# Patient Record
Sex: Female | Born: 1950 | Race: White | Hispanic: No | Smoking: Never smoker
Health system: Southern US, Community
[De-identification: ages and names within clinical notes are randomized; demographics above are authoritative.]

## PROBLEM LIST (undated history)

## (undated) DIAGNOSIS — D649 Anemia, unspecified: Secondary | ICD-10-CM

## (undated) DIAGNOSIS — H9190 Unspecified hearing loss, unspecified ear: Secondary | ICD-10-CM

## (undated) DIAGNOSIS — C801 Malignant (primary) neoplasm, unspecified: Secondary | ICD-10-CM

## (undated) DIAGNOSIS — Z8759 Personal history of other complications of pregnancy, childbirth and the puerperium: Secondary | ICD-10-CM

## (undated) DIAGNOSIS — M1990 Unspecified osteoarthritis, unspecified site: Secondary | ICD-10-CM

## (undated) DIAGNOSIS — Z87898 Personal history of other specified conditions: Secondary | ICD-10-CM

## (undated) DIAGNOSIS — N83209 Unspecified ovarian cyst, unspecified side: Secondary | ICD-10-CM

## (undated) DIAGNOSIS — Z9882 Breast implant status: Secondary | ICD-10-CM

## (undated) DIAGNOSIS — I10 Essential (primary) hypertension: Secondary | ICD-10-CM

## (undated) DIAGNOSIS — Z87828 Personal history of other (healed) physical injury and trauma: Secondary | ICD-10-CM

## (undated) HISTORY — PX: COSMETIC SURGERY: SHX468

## (undated) HISTORY — DX: Anemia, unspecified: D64.9

## (undated) HISTORY — DX: Personal history of other specified conditions: Z87.898

## (undated) HISTORY — DX: Personal history of other complications of pregnancy, childbirth and the puerperium: Z87.59

## (undated) HISTORY — DX: Breast implant status: Z98.82

## (undated) HISTORY — DX: Personal history of other (healed) physical injury and trauma: Z87.828

## (undated) HISTORY — PX: GYNECOLOGIC CRYOSURGERY: SHX857

## (undated) HISTORY — DX: Unspecified ovarian cyst, unspecified side: N83.209

## (undated) HISTORY — PX: NOSE SURGERY: SHX723

---
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
## 1978-09-13 HISTORY — PX: AUGMENTATION MAMMAPLASTY: SUR837

## 2005-05-07 ENCOUNTER — Ambulatory Visit: Payer: Self-pay | Admitting: Internal Medicine

## 2006-01-17 ENCOUNTER — Ambulatory Visit: Payer: Self-pay | Admitting: Unknown Physician Specialty

## 2006-06-24 ENCOUNTER — Ambulatory Visit: Payer: Self-pay | Admitting: Internal Medicine

## 2006-09-02 ENCOUNTER — Ambulatory Visit: Payer: Self-pay | Admitting: Urology

## 2007-10-30 ENCOUNTER — Ambulatory Visit: Payer: Self-pay | Admitting: Internal Medicine

## 2008-10-31 ENCOUNTER — Ambulatory Visit: Payer: Self-pay | Admitting: Internal Medicine

## 2009-11-20 ENCOUNTER — Ambulatory Visit: Payer: Self-pay | Admitting: Internal Medicine

## 2010-11-23 ENCOUNTER — Ambulatory Visit: Payer: Self-pay | Admitting: Internal Medicine

## 2010-11-23 LAB — HM MAMMOGRAPHY

## 2011-11-19 LAB — HM PAP SMEAR

## 2011-11-26 LAB — HEPATIC FUNCTION PANEL
AST: 23 U/L (ref 13–35)
Bilirubin, Total: 0.4 mg/dL

## 2011-11-26 LAB — BASIC METABOLIC PANEL
BUN: 21 mg/dL (ref 4–21)
Creatinine: 0.8 mg/dL (ref <=1.1)
Glucose: 76 mg/dL
Potassium: 4.5 mmol/L (ref 3.4–5.3)

## 2011-11-26 LAB — LIPID PANEL: Triglycerides: 81 mg/dL (ref 40–160)

## 2011-12-28 ENCOUNTER — Ambulatory Visit: Payer: Self-pay | Admitting: Internal Medicine

## 2011-12-28 LAB — HM MAMMOGRAPHY

## 2012-10-09 ENCOUNTER — Encounter: Payer: Self-pay | Admitting: *Deleted

## 2012-10-10 ENCOUNTER — Ambulatory Visit (INDEPENDENT_AMBULATORY_CARE_PROVIDER_SITE_OTHER): Payer: BC Managed Care – PPO | Admitting: Internal Medicine

## 2012-10-10 ENCOUNTER — Other Ambulatory Visit (HOSPITAL_COMMUNITY)
Admission: RE | Admit: 2012-10-10 | Discharge: 2012-10-10 | Disposition: A | Discharge status: Home / Self Care | Payer: BC Managed Care – PPO | Source: Ambulatory Visit | Attending: Internal Medicine | Admitting: Internal Medicine

## 2012-10-10 ENCOUNTER — Encounter: Payer: Self-pay | Admitting: Internal Medicine

## 2012-10-10 VITALS — BP 110/60 | HR 66 | Temp 98.4°F | Ht 66.0 in | Wt 143.8 lb

## 2012-10-10 DIAGNOSIS — Z87898 Personal history of other specified conditions: Secondary | ICD-10-CM

## 2012-10-10 DIAGNOSIS — Z01419 Encounter for gynecological examination (general) (routine) without abnormal findings: Secondary | ICD-10-CM | POA: Insufficient documentation

## 2012-10-10 DIAGNOSIS — C439 Malignant melanoma of skin, unspecified: Secondary | ICD-10-CM

## 2012-10-10 DIAGNOSIS — R319 Hematuria, unspecified: Secondary | ICD-10-CM

## 2012-10-10 DIAGNOSIS — Z139 Encounter for screening, unspecified: Secondary | ICD-10-CM

## 2012-10-10 DIAGNOSIS — Z8742 Personal history of other diseases of the female genital tract: Secondary | ICD-10-CM

## 2012-10-10 DIAGNOSIS — D649 Anemia, unspecified: Secondary | ICD-10-CM

## 2012-10-10 DIAGNOSIS — R0989 Other specified symptoms and signs involving the circulatory and respiratory systems: Secondary | ICD-10-CM

## 2012-10-10 DIAGNOSIS — R5383 Other fatigue: Secondary | ICD-10-CM

## 2012-10-10 DIAGNOSIS — Z800 Family history of malignant neoplasm of digestive organs: Secondary | ICD-10-CM

## 2012-10-10 DIAGNOSIS — Z1151 Encounter for screening for human papillomavirus (HPV): Secondary | ICD-10-CM | POA: Insufficient documentation

## 2012-10-10 MED ORDER — ALPRAZOLAM 0.25 MG PO TABS: 0.2500 mg | ORAL_TABLET | Freq: Every day | ORAL | Status: DC | PRN

## 2012-10-11 ENCOUNTER — Encounter: Payer: Self-pay | Admitting: Internal Medicine

## 2012-10-11 DIAGNOSIS — D649 Anemia, unspecified: Secondary | ICD-10-CM | POA: Insufficient documentation

## 2012-10-11 DIAGNOSIS — Z800 Family history of malignant neoplasm of digestive organs: Secondary | ICD-10-CM | POA: Insufficient documentation

## 2012-10-11 DIAGNOSIS — R319 Hematuria, unspecified: Secondary | ICD-10-CM | POA: Insufficient documentation

## 2012-10-11 DIAGNOSIS — C439 Malignant melanoma of skin, unspecified: Secondary | ICD-10-CM | POA: Insufficient documentation

## 2012-10-11 DIAGNOSIS — R87619 Unspecified abnormal cytological findings in specimens from cervix uteri: Secondary | ICD-10-CM | POA: Insufficient documentation
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
## 2012-10-11 NOTE — Assessment & Plan Note (Signed)
Last colonoscopy 2007.  Overdue.  Refer back to GI for colonoscopy.

## 2012-10-11 NOTE — Assessment & Plan Note (Signed)
S/p cryosurgery.  Repeat pap today.

## 2012-10-11 NOTE — Assessment & Plan Note (Signed)
Colonoscopy as outlined.  Overdue.  Will refer back for colonoscopy.  Recheck cbc.

## 2012-10-11 NOTE — Assessment & Plan Note (Signed)
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  Recheck urinalysis with next labs.  (saw Dr Achilles Dunk).

## 2012-10-11 NOTE — Assessment & Plan Note (Signed)
Sees dermatology.  CXR 07/21/10 - negative.  Was going to discuss with dermatology regarding serial cxr's.  Check liver panel.  Follow.

## 2012-10-16 ENCOUNTER — Ambulatory Visit: Payer: Self-pay | Admitting: Internal Medicine

## 2012-10-19 ENCOUNTER — Other Ambulatory Visit (INDEPENDENT_AMBULATORY_CARE_PROVIDER_SITE_OTHER): Payer: BC Managed Care – PPO

## 2012-10-19 DIAGNOSIS — R5381 Other malaise: Secondary | ICD-10-CM

## 2012-10-19 DIAGNOSIS — R5383 Other fatigue: Secondary | ICD-10-CM

## 2012-10-19 DIAGNOSIS — D649 Anemia, unspecified: Secondary | ICD-10-CM

## 2012-10-19 DIAGNOSIS — Z139 Encounter for screening, unspecified: Secondary | ICD-10-CM

## 2012-10-19 DIAGNOSIS — R319 Hematuria, unspecified: Secondary | ICD-10-CM

## 2012-10-19 LAB — COMPREHENSIVE METABOLIC PANEL
ALT: 18 U/L (ref 0–35)
AST: 23 U/L (ref 0–37)
Albumin: 3.9 g/dL (ref 3.5–5.2)
BUN: 18 mg/dL (ref 6–23)
CO2: 28 mEq/L (ref 19–32)
Calcium: 9.3 mg/dL (ref 8.4–10.5)
Chloride: 102 mEq/L (ref 96–112)
Creatinine, Ser: 0.7 mg/dL (ref 0.4–1.2)
GFR: 85.02 mL/min (ref >60.00)
Potassium: 4.7 mEq/L (ref 3.5–5.1)

## 2012-10-19 LAB — CBC WITH DIFFERENTIAL/PLATELET
Basophils Relative: 0.5 % (ref 0.0–3.0)
Eosinophils Absolute: 0.1 10*3/uL (ref 0.0–0.7)
Hemoglobin: 11.2 g/dL — ABNORMAL LOW (ref 12.0–15.0)
Lymphocytes Relative: 27.9 % (ref 12.0–46.0)
Monocytes Relative: 9.2 % (ref 3.0–12.0)
Neutro Abs: 3.8 10*3/uL (ref 1.4–7.7)
Neutrophils Relative %: 61.1 % (ref 43.0–77.0)
RBC: 3.72 Mil/uL — ABNORMAL LOW (ref 3.87–5.11)
WBC: 6.3 10*3/uL (ref 4.5–10.5)

## 2012-10-19 LAB — LIPID PANEL
Cholesterol: 197 mg/dL (ref 0–200)
Total CHOL/HDL Ratio: 3
Triglycerides: 61 mg/dL (ref 0.0–149.0)

## 2012-10-19 LAB — URINALYSIS, ROUTINE W REFLEX MICROSCOPIC
Nitrite: NEGATIVE
Specific Gravity, Urine: <=1.005 (ref 1.000–1.030)
Urine Glucose: NEGATIVE
Urobilinogen, UA: 0.2 (ref 0.0–1.0)

## 2012-10-20 ENCOUNTER — Encounter: Payer: Self-pay | Admitting: Internal Medicine

## 2012-10-20 ENCOUNTER — Telehealth: Payer: Self-pay | Admitting: Internal Medicine

## 2012-10-20 NOTE — Telephone Encounter (Signed)
Pt notified of labs via my chart.  

## 2012-10-26 ENCOUNTER — Encounter: Payer: Self-pay | Admitting: Internal Medicine

## 2012-10-28 ENCOUNTER — Other Ambulatory Visit: Payer: Self-pay

## 2012-10-30 ENCOUNTER — Encounter: Payer: Self-pay | Admitting: Internal Medicine

## 2013-01-02 ENCOUNTER — Ambulatory Visit: Payer: Self-pay | Admitting: Internal Medicine

## 2013-01-06 ENCOUNTER — Encounter: Payer: Self-pay | Admitting: Internal Medicine

## 2013-01-06 DIAGNOSIS — D649 Anemia, unspecified: Secondary | ICD-10-CM

## 2013-01-29 ENCOUNTER — Encounter: Payer: Self-pay | Admitting: Internal Medicine

## 2013-04-09 ENCOUNTER — Ambulatory Visit: Payer: BC Managed Care – PPO | Admitting: Internal Medicine

## 2013-05-15 ENCOUNTER — Ambulatory Visit (INDEPENDENT_AMBULATORY_CARE_PROVIDER_SITE_OTHER): Payer: BC Managed Care – PPO | Admitting: Internal Medicine

## 2013-05-15 ENCOUNTER — Encounter: Payer: Self-pay | Admitting: Internal Medicine

## 2013-05-15 VITALS — BP 102/70 | HR 121 | Temp 98.2°F | Wt 143.0 lb

## 2013-05-15 DIAGNOSIS — R319 Hematuria, unspecified: Secondary | ICD-10-CM

## 2013-05-15 DIAGNOSIS — Z8742 Personal history of other diseases of the female genital tract: Secondary | ICD-10-CM

## 2013-05-15 DIAGNOSIS — Z87898 Personal history of other specified conditions: Secondary | ICD-10-CM

## 2013-05-15 DIAGNOSIS — C439 Malignant melanoma of skin, unspecified: Secondary | ICD-10-CM

## 2013-05-15 DIAGNOSIS — Z800 Family history of malignant neoplasm of digestive organs: Secondary | ICD-10-CM

## 2013-05-15 DIAGNOSIS — D649 Anemia, unspecified: Secondary | ICD-10-CM
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
## 2013-05-16 LAB — CBC WITH DIFFERENTIAL/PLATELET
Basophils Relative: 0.9 % (ref 0.0–3.0)
Eosinophils Absolute: 0.2 10*3/uL (ref 0.0–0.7)
Eosinophils Relative: 3.5 % (ref 0.0–5.0)
Hemoglobin: 10.7 g/dL — ABNORMAL LOW (ref 12.0–15.0)
Lymphocytes Relative: 36.9 % (ref 12.0–46.0)
MCHC: 34.3 g/dL (ref 30.0–36.0)
MCV: 90.4 fl (ref 78.0–100.0)
Monocytes Absolute: 0.7 10*3/uL (ref 0.1–1.0)
Neutro Abs: 3 10*3/uL (ref 1.4–7.7)
RBC: 3.45 Mil/uL — ABNORMAL LOW (ref 3.87–5.11)
WBC: 6.3 10*3/uL (ref 4.5–10.5)

## 2013-05-17 ENCOUNTER — Encounter: Payer: Self-pay | Admitting: Internal Medicine

## 2013-05-17 ENCOUNTER — Telehealth: Payer: Self-pay | Admitting: Internal Medicine

## 2013-05-17 DIAGNOSIS — D649 Anemia, unspecified: Secondary | ICD-10-CM

## 2013-05-17 NOTE — Telephone Encounter (Signed)
Pt notified via my chart of lab results and need for f/u lab within the next few weeks.  Please schedule her for a non fasting lab appt within the next 3 weeks and notify her of appt date and time.    Thanks.

## 2013-05-18 ENCOUNTER — Encounter: Payer: Self-pay | Admitting: Internal Medicine

## 2013-05-18 NOTE — Assessment & Plan Note (Signed)
S/p cryosurgery.  Last pap 1/14 - ok.     

## 2013-05-18 NOTE — Assessment & Plan Note (Signed)
Last colonoscopy 2007.  Overdue.  Discussed with her today regarding referral back to GI.  She declines.  States she will notify me when agreeable.

## 2013-05-18 NOTE — Assessment & Plan Note (Signed)
Colonoscopy as outlined.  Overdue.  Declines referral back at this time.  Will notify me when agreeable.  Recheck cbc.

## 2013-05-18 NOTE — Assessment & Plan Note (Signed)
Sees dermatology.  CXR 07/21/10 - negative.  Was going to discuss with dermatology regarding serial cxr's.  Follow liver panel.

## 2013-05-18 NOTE — Telephone Encounter (Signed)
Appointment 9/29 pt aware

## 2013-05-18 NOTE — Assessment & Plan Note (Signed)
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  Saw Dr Cope.  Last urinalysis - no rbc's.   

## 2013-06-11 ENCOUNTER — Other Ambulatory Visit (INDEPENDENT_AMBULATORY_CARE_PROVIDER_SITE_OTHER): Payer: BC Managed Care – PPO

## 2013-06-11 DIAGNOSIS — D649 Anemia, unspecified: Secondary | ICD-10-CM

## 2013-06-11 LAB — IBC PANEL
Iron: 62 ug/dL (ref 42–145)
Transferrin: 229.2 mg/dL (ref 212.0–360.0)

## 2013-06-11 LAB — CBC WITH DIFFERENTIAL/PLATELET
Basophils Absolute: 0 10*3/uL (ref 0.0–0.1)
Basophils Relative: 0.3 % (ref 0.0–3.0)
Eosinophils Absolute: 0.2 10*3/uL (ref 0.0–0.7)
Lymphocytes Relative: 35.2 % (ref 12.0–46.0)
MCHC: 34 g/dL (ref 30.0–36.0)
Neutrophils Relative %: 53.2 % (ref 43.0–77.0)
Platelets: 366 10*3/uL (ref 150.0–400.0)
RBC: 3.45 Mil/uL — ABNORMAL LOW (ref 3.87–5.11)

## 2013-06-11 LAB — VITAMIN B12: Vitamin B-12: 1027 pg/mL — ABNORMAL HIGH (ref 211–911)

## 2013-06-13 ENCOUNTER — Telehealth: Payer: Self-pay | Admitting: Internal Medicine

## 2013-06-13 DIAGNOSIS — D649 Anemia, unspecified: Secondary | ICD-10-CM

## 2013-06-13 NOTE — Telephone Encounter (Signed)
See my chart message Appointment Request From: Areatha Keas  With Provider: Charm Barges, MD [-Primary Care Physician-]  Preferred Date Range: From 07/17/2013 To 08/07/2013  Preferred Times: Tuesday Morning Reason: To address the following health maintenance concerns. Colonoscopy  Comments: Please schedule my colonoscopy on any Tues morning in November or please give me the number to call to schedule it. Not having any problems. I was reminded by Valinda Hoar that it is overdue. Thanks, [employer]

## 2013-06-13 NOTE — Telephone Encounter (Signed)
See her message.  Order placed for gastr referral. Needs colonoscopy.  See her message for times or she will call.  Thanks.

## 2013-06-14 ENCOUNTER — Encounter: Payer: Self-pay | Admitting: Internal Medicine

## 2013-07-09 ENCOUNTER — Telehealth: Payer: Self-pay | Admitting: Internal Medicine

## 2013-07-09 NOTE — Telephone Encounter (Signed)
In your purple folder

## 2013-07-09 NOTE — Telephone Encounter (Signed)
Form completed.  Placed in your box.   

## 2013-07-09 NOTE — Telephone Encounter (Signed)
Pt came into clinic to drop of paperwork for proof of physical.  Placed in Dr. Roby Lofts box.  Please call pt when ready for pickup.

## 2013-07-10 NOTE — Telephone Encounter (Signed)
Left message on pt voicemail to notify pt that her form has been placed up front

## 2013-07-19 ENCOUNTER — Other Ambulatory Visit: Payer: Self-pay

## 2013-10-11 LAB — HM COLONOSCOPY

## 2013-10-18 ENCOUNTER — Encounter: Payer: Self-pay | Admitting: Internal Medicine

## 2013-11-08 ENCOUNTER — Other Ambulatory Visit: Payer: BC Managed Care – PPO

## 2013-11-11 ENCOUNTER — Encounter: Payer: Self-pay | Admitting: Internal Medicine

## 2013-11-14 ENCOUNTER — Telehealth: Payer: Self-pay | Admitting: *Deleted

## 2013-11-14 ENCOUNTER — Other Ambulatory Visit (INDEPENDENT_AMBULATORY_CARE_PROVIDER_SITE_OTHER): Payer: BC Managed Care – PPO

## 2013-11-14 ENCOUNTER — Encounter: Payer: Self-pay | Admitting: Internal Medicine

## 2013-11-14 DIAGNOSIS — D649 Anemia, unspecified: Secondary | ICD-10-CM

## 2013-11-14 LAB — CBC WITH DIFFERENTIAL/PLATELET
BASOS ABS: 0 10*3/uL (ref 0.0–0.1)
BASOS PCT: 0.5 % (ref 0.0–3.0)
EOS PCT: 0.3 % (ref 0.0–5.0)
Eosinophils Absolute: 0 10*3/uL (ref 0.0–0.7)
HCT: 33.1 % — ABNORMAL LOW (ref 36.0–46.0)
HEMOGLOBIN: 11 g/dL — AB (ref 12.0–15.0)
LYMPHS PCT: 33.3 % (ref 12.0–46.0)
Lymphs Abs: 1.6 10*3/uL (ref 0.7–4.0)
MCHC: 33.3 g/dL (ref 30.0–36.0)
MCV: 92.8 fl (ref 78.0–100.0)
MONOS PCT: 23.5 % — AB (ref 3.0–12.0)
Monocytes Absolute: 1.1 10*3/uL — ABNORMAL HIGH (ref 0.1–1.0)
NEUTROS ABS: 2 10*3/uL (ref 1.4–7.7)
Neutrophils Relative %: 42.4 % — ABNORMAL LOW (ref 43.0–77.0)
Platelets: 290 10*3/uL (ref 150.0–400.0)
RBC: 3.57 Mil/uL — AB (ref 3.87–5.11)
RDW: 13 % (ref 11.5–14.6)
WBC: 4.8 10*3/uL (ref 4.5–10.5)

## 2013-11-14 NOTE — Telephone Encounter (Signed)
Cbc ordered

## 2013-11-14 NOTE — Telephone Encounter (Signed)
What labs and dx?  

## 2013-11-15 ENCOUNTER — Encounter: Payer: Self-pay | Admitting: Internal Medicine

## 2013-11-15 ENCOUNTER — Ambulatory Visit (INDEPENDENT_AMBULATORY_CARE_PROVIDER_SITE_OTHER): Payer: BC Managed Care – PPO | Admitting: Internal Medicine

## 2013-11-15 VITALS — BP 110/68 | HR 70 | Temp 98.5°F | Ht 64.75 in | Wt 129.2 lb

## 2013-11-15 DIAGNOSIS — Z1239 Encounter for other screening for malignant neoplasm of breast: Secondary | ICD-10-CM

## 2013-11-15 DIAGNOSIS — Z13220 Encounter for screening for lipoid disorders: Secondary | ICD-10-CM

## 2013-11-15 DIAGNOSIS — Z800 Family history of malignant neoplasm of digestive organs: Secondary | ICD-10-CM

## 2013-11-15 DIAGNOSIS — R059 Cough, unspecified: Secondary | ICD-10-CM

## 2013-11-15 DIAGNOSIS — Z978 Presence of other specified devices: Secondary | ICD-10-CM

## 2013-11-15 DIAGNOSIS — R319 Hematuria, unspecified: Secondary | ICD-10-CM

## 2013-11-15 DIAGNOSIS — Z9882 Breast implant status: Secondary | ICD-10-CM

## 2013-11-15 DIAGNOSIS — C439 Malignant melanoma of skin, unspecified: Secondary | ICD-10-CM

## 2013-11-15 DIAGNOSIS — D649 Anemia, unspecified: Secondary | ICD-10-CM

## 2013-11-15 DIAGNOSIS — R05 Cough: Secondary | ICD-10-CM

## 2013-11-15 NOTE — Progress Notes (Signed)
Pre-visit discussion using our clinic review tool. No additional management support is needed unless otherwise documented below in the visit note.  

## 2013-11-18 ENCOUNTER — Encounter: Payer: Self-pay | Admitting: Internal Medicine

## 2013-11-18 NOTE — Assessment & Plan Note (Signed)
Colonoscopy and EGD as outlined.  Recent hgb 11.0.  Follow.

## 2013-11-18 NOTE — Assessment & Plan Note (Signed)
S/p cryosurgery.  Last pap 1/14 - ok.     

## 2013-11-18 NOTE — Assessment & Plan Note (Signed)
Colonoscopy as outlined.  Follow.  Recommend f/u colonoscopy in 3 years.
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
## 2013-11-18 NOTE — Assessment & Plan Note (Signed)
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  Saw Dr Cope.  Last urinalysis - no rbc's.   

## 2013-11-18 NOTE — Assessment & Plan Note (Signed)
Sees dermatology.  CXR 07/21/10 - negative.   Follow liver panel.  Check cxr.

## 2013-11-21 ENCOUNTER — Telehealth: Payer: Self-pay | Admitting: Internal Medicine

## 2013-11-21 ENCOUNTER — Encounter: Payer: Self-pay | Admitting: Emergency Medicine

## 2013-11-21 ENCOUNTER — Other Ambulatory Visit: Payer: Self-pay | Admitting: Internal Medicine

## 2013-11-21 DIAGNOSIS — Z13220 Encounter for screening for lipoid disorders: Secondary | ICD-10-CM

## 2013-11-21 DIAGNOSIS — C439 Malignant melanoma of skin, unspecified: Secondary | ICD-10-CM

## 2013-11-21 DIAGNOSIS — D649 Anemia, unspecified: Secondary | ICD-10-CM

## 2013-11-21 NOTE — Telephone Encounter (Signed)
See below message Please let me know if it is ok to make appointment     Appointment Request From: Dallie Dad      With Provider: Alisa Graff, MD [-Primary Care Physician-]      Preferred Date Range: From 11/21/2013 To 11/23/2013      Preferred Times: Wednesday Morning, Thursday Morning, Friday Morning      Reason for visit: Acute Office Visit      Comments:   Dr. Nicki Reaper forgot to check my cholesterol when she ordered my blood work last week. Can you please ask her to schedule that test.    Thanks,   Silva Bandy

## 2013-11-21 NOTE — Telephone Encounter (Signed)
Ok to schedule a fasting lab on one of these days.  I will place the order for the labs.  Thanks.

## 2013-11-21 NOTE — Progress Notes (Signed)
Order placed for f/u labs.  

## 2013-11-22 NOTE — Telephone Encounter (Signed)
Appointment  3/13 sent my chart message letting pt know about appointment

## 2013-11-23 ENCOUNTER — Other Ambulatory Visit: Payer: BC Managed Care – PPO

## 2013-11-30 ENCOUNTER — Telehealth: Payer: Self-pay | Admitting: Internal Medicine

## 2013-11-30 NOTE — Telephone Encounter (Signed)
Sent mychart message

## 2013-11-30 NOTE — Telephone Encounter (Signed)
If she is due a tetanus booster, I am ok with her getting a tetanus

## 2013-11-30 NOTE — Telephone Encounter (Signed)
Pt has lab work on 3/27 and wanted to know would like to schedule a tetnus shot at the same time as my lab work if that is OK.   Is this ok to scheudle

## 2013-11-30 NOTE — Telephone Encounter (Signed)
lotoya can you see if ms wolfe is due a tetanus booster Thanks

## 2013-11-30 NOTE — Telephone Encounter (Signed)
We have no immunization records on pt, she will have to know if she is due or not.

## 2013-12-07 ENCOUNTER — Other Ambulatory Visit (INDEPENDENT_AMBULATORY_CARE_PROVIDER_SITE_OTHER): Payer: BC Managed Care – PPO

## 2013-12-07 DIAGNOSIS — Z13220 Encounter for screening for lipoid disorders: Secondary | ICD-10-CM

## 2013-12-07 DIAGNOSIS — C439 Malignant melanoma of skin, unspecified: Secondary | ICD-10-CM

## 2013-12-07 LAB — COMPREHENSIVE METABOLIC PANEL
ALT: 19 U/L (ref 0–35)
AST: 27 U/L (ref 0–37)
Albumin: 3.8 g/dL (ref 3.5–5.2)
Alkaline Phosphatase: 33 U/L — ABNORMAL LOW (ref 39–117)
BILIRUBIN TOTAL: 0.5 mg/dL (ref 0.3–1.2)
BUN: 13 mg/dL (ref 6–23)
CO2: 30 mEq/L (ref 19–32)
Calcium: 9.2 mg/dL (ref 8.4–10.5)
Chloride: 104 mEq/L (ref 96–112)
Creatinine, Ser: 0.8 mg/dL (ref 0.4–1.2)
GFR: 83.4 mL/min (ref >60.00)
Glucose, Bld: 84 mg/dL (ref 70–99)
Potassium: 4.5 mEq/L (ref 3.5–5.1)
SODIUM: 138 meq/L (ref 135–145)
Total Protein: 6.8 g/dL (ref 6.0–8.3)

## 2013-12-07 LAB — LIPID PANEL
CHOL/HDL RATIO: 3
Cholesterol: 211 mg/dL — ABNORMAL HIGH (ref 0–200)
HDL: 74.3 mg/dL (ref >39.00)
LDL Cholesterol: 128 mg/dL — ABNORMAL HIGH (ref 0–99)
Triglycerides: 43 mg/dL (ref 0.0–149.0)
VLDL: 8.6 mg/dL (ref 0.0–40.0)

## 2013-12-07 LAB — TSH: TSH: 1.69 u[IU]/mL (ref 0.35–5.50)

## 2013-12-08 ENCOUNTER — Encounter: Payer: Self-pay | Admitting: Internal Medicine

## 2013-12-20 ENCOUNTER — Encounter: Payer: Self-pay | Admitting: Internal Medicine

## 2014-01-14 ENCOUNTER — Ambulatory Visit: Payer: Self-pay | Admitting: Internal Medicine

## 2014-01-14 LAB — HM MAMMOGRAPHY: HM Mammogram: NEGATIVE

## 2014-01-15 ENCOUNTER — Encounter: Payer: Self-pay | Admitting: Internal Medicine

## 2014-01-25 ENCOUNTER — Ambulatory Visit (INDEPENDENT_AMBULATORY_CARE_PROVIDER_SITE_OTHER): Payer: BC Managed Care – PPO | Admitting: *Deleted

## 2014-01-25 DIAGNOSIS — Z23 Encounter for immunization: Secondary | ICD-10-CM

## 2014-05-22 ENCOUNTER — Encounter: Payer: Self-pay | Admitting: Internal Medicine

## 2014-05-22 ENCOUNTER — Ambulatory Visit (INDEPENDENT_AMBULATORY_CARE_PROVIDER_SITE_OTHER): Payer: BC Managed Care – PPO | Admitting: Internal Medicine

## 2014-05-22 VITALS — BP 110/70 | HR 63 | Temp 98.3°F | Ht 64.75 in | Wt 130.5 lb

## 2014-05-22 DIAGNOSIS — D649 Anemia, unspecified: Secondary | ICD-10-CM

## 2014-05-22 DIAGNOSIS — R319 Hematuria, unspecified: Secondary | ICD-10-CM

## 2014-05-22 DIAGNOSIS — C439 Malignant melanoma of skin, unspecified: Secondary | ICD-10-CM

## 2014-05-22 DIAGNOSIS — Z800 Family history of malignant neoplasm of digestive organs: Secondary | ICD-10-CM

## 2014-05-22 LAB — CBC WITH DIFFERENTIAL/PLATELET
Basophils Absolute: 0 10*3/uL (ref 0.0–0.1)
Basophils Relative: 0.3 % (ref 0.0–3.0)
EOS PCT: 1.4 % (ref 0.0–5.0)
Eosinophils Absolute: 0.1 10*3/uL (ref 0.0–0.7)
HEMATOCRIT: 33.9 % — AB (ref 36.0–46.0)
HEMOGLOBIN: 11.3 g/dL — AB (ref 12.0–15.0)
LYMPHS ABS: 1.8 10*3/uL (ref 0.7–4.0)
Lymphocytes Relative: 29.9 % (ref 12.0–46.0)
MCHC: 33.3 g/dL (ref 30.0–36.0)
MCV: 92 fl (ref 78.0–100.0)
Monocytes Absolute: 0.6 10*3/uL (ref 0.1–1.0)
Monocytes Relative: 10.3 % (ref 3.0–12.0)
NEUTROS ABS: 3.6 10*3/uL (ref 1.4–7.7)
Neutrophils Relative %: 58.1 % (ref 43.0–77.0)
Platelets: 393 10*3/uL (ref 150.0–400.0)
RBC: 3.68 Mil/uL — AB (ref 3.87–5.11)
RDW: 12.9 % (ref 11.5–15.5)
WBC: 6.2 10*3/uL (ref 4.0–10.5)

## 2014-05-22 LAB — IBC PANEL
IRON: 139 ug/dL (ref 42–145)
Saturation Ratios: 40.4 % (ref 20.0–50.0)
Transferrin: 245.6 mg/dL (ref 212.0–360.0)

## 2014-05-22 LAB — FERRITIN: Ferritin: 32.5 ng/mL (ref 10.0–291.0)

## 2014-05-22 NOTE — Progress Notes (Signed)
Pre visit review using our clinic review tool, if applicable. No additional management support is needed unless otherwise documented below in the visit note. 
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
## 2014-05-26 ENCOUNTER — Encounter: Payer: Self-pay | Admitting: Internal Medicine

## 2014-05-26 NOTE — Assessment & Plan Note (Signed)
Colonoscopy as outlined.  Follow.  Recommend f/u colonoscopy in 3 years. (due 2018)

## 2014-05-26 NOTE — Assessment & Plan Note (Signed)
Colonoscopy and EGD as outlined.  Recent hgb 11.0.  Recheck cbc and ferritin today.  On iron.  Recommended f/u colonoscopy in three years.

## 2014-05-26 NOTE — Assessment & Plan Note (Signed)
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  Saw Dr Jacqlyn Larsen.  Last urinalysis - no rbc's.

## 2014-05-26 NOTE — Assessment & Plan Note (Signed)
Sees dermatology.  CXR 07/21/10 - negative.   Follow liver panel.  She has still not had her cxr.  Plans to get.

## 2014-05-26 NOTE — Assessment & Plan Note (Signed)
S/p cryosurgery.  Last pap 1/14 - ok.

## 2014-07-12 ENCOUNTER — Telehealth: Payer: Self-pay | Admitting: *Deleted

## 2014-07-12 DIAGNOSIS — D649 Anemia, unspecified: Secondary | ICD-10-CM

## 2014-07-12 DIAGNOSIS — C439 Malignant melanoma of skin, unspecified: Secondary | ICD-10-CM

## 2014-07-12 DIAGNOSIS — E7800 Pure hypercholesterolemia, unspecified: Secondary | ICD-10-CM

## 2014-07-12 NOTE — Telephone Encounter (Signed)
Pt is coming 11.02.2015 what labs and dx?

## 2014-07-14 NOTE — Telephone Encounter (Signed)
Orders placed for labs

## 2014-07-15 ENCOUNTER — Other Ambulatory Visit (INDEPENDENT_AMBULATORY_CARE_PROVIDER_SITE_OTHER): Payer: BC Managed Care – PPO

## 2014-07-15 DIAGNOSIS — E7800 Pure hypercholesterolemia, unspecified: Secondary | ICD-10-CM

## 2014-07-15 DIAGNOSIS — C439 Malignant melanoma of skin, unspecified: Secondary | ICD-10-CM

## 2014-07-15 DIAGNOSIS — D649 Anemia, unspecified: Secondary | ICD-10-CM

## 2014-07-15 LAB — LIPID PANEL
Cholesterol: 200 mg/dL (ref 0–200)
HDL: 70.3 mg/dL (ref >39.00)
LDL CALC: 117 mg/dL — AB (ref 0–99)
NONHDL: 129.7
TRIGLYCERIDES: 62 mg/dL (ref 0.0–149.0)
Total CHOL/HDL Ratio: 3
VLDL: 12.4 mg/dL (ref 0.0–40.0)

## 2014-07-15 LAB — CBC WITH DIFFERENTIAL/PLATELET
BASOS ABS: 0 10*3/uL (ref 0.0–0.1)
Basophils Relative: 0.3 % (ref 0.0–3.0)
Eosinophils Absolute: 0.1 10*3/uL (ref 0.0–0.7)
Eosinophils Relative: 1.6 % (ref 0.0–5.0)
HEMATOCRIT: 33.6 % — AB (ref 36.0–46.0)
Hemoglobin: 11.2 g/dL — ABNORMAL LOW (ref 12.0–15.0)
LYMPHS ABS: 1.9 10*3/uL (ref 0.7–4.0)
LYMPHS PCT: 31.1 % (ref 12.0–46.0)
MCHC: 33.3 g/dL (ref 30.0–36.0)
MCV: 90.7 fl (ref 78.0–100.0)
Monocytes Absolute: 0.6 10*3/uL (ref 0.1–1.0)
Monocytes Relative: 9.6 % (ref 3.0–12.0)
Neutro Abs: 3.6 10*3/uL (ref 1.4–7.7)
Neutrophils Relative %: 57.4 % (ref 43.0–77.0)
Platelets: 384 10*3/uL (ref 150.0–400.0)
RBC: 3.71 Mil/uL — ABNORMAL LOW (ref 3.87–5.11)
RDW: 12.8 % (ref 11.5–15.5)
WBC: 6.2 10*3/uL (ref 4.0–10.5)

## 2014-07-15 LAB — COMPREHENSIVE METABOLIC PANEL
ALK PHOS: 38 U/L — AB (ref 39–117)
ALT: 12 U/L (ref 0–35)
AST: 25 U/L (ref 0–37)
Albumin: 3.4 g/dL — ABNORMAL LOW (ref 3.5–5.2)
BUN: 9 mg/dL (ref 6–23)
CO2: 27 mEq/L (ref 19–32)
CREATININE: 0.7 mg/dL (ref 0.4–1.2)
Calcium: 9.2 mg/dL (ref 8.4–10.5)
Chloride: 103 mEq/L (ref 96–112)
GFR: 84.53 mL/min (ref >60.00)
Glucose, Bld: 93 mg/dL (ref 70–99)
Potassium: 4.2 mEq/L (ref 3.5–5.1)
Sodium: 138 mEq/L (ref 135–145)
Total Bilirubin: 0.4 mg/dL (ref 0.2–1.2)
Total Protein: 7.3 g/dL (ref 6.0–8.3)

## 2014-07-15 LAB — FERRITIN: FERRITIN: 31.3 ng/mL (ref 10.0–291.0)

## 2014-07-16 ENCOUNTER — Encounter: Payer: Self-pay | Admitting: Internal Medicine

## 2014-11-20 ENCOUNTER — Ambulatory Visit (INDEPENDENT_AMBULATORY_CARE_PROVIDER_SITE_OTHER): Payer: BLUE CROSS/BLUE SHIELD | Admitting: Internal Medicine

## 2014-11-20 ENCOUNTER — Encounter: Payer: Self-pay | Admitting: Internal Medicine

## 2014-11-20 VITALS — BP 110/70 | HR 66 | Temp 98.6°F | Ht 64.75 in | Wt 136.4 lb

## 2014-11-20 DIAGNOSIS — R0989 Other specified symptoms and signs involving the circulatory and respiratory systems: Secondary | ICD-10-CM

## 2014-11-20 DIAGNOSIS — R87619 Unspecified abnormal cytological findings in specimens from cervix uteri: Secondary | ICD-10-CM

## 2014-11-20 DIAGNOSIS — C439 Malignant melanoma of skin, unspecified: Secondary | ICD-10-CM

## 2014-11-20 DIAGNOSIS — E7800 Pure hypercholesterolemia, unspecified: Secondary | ICD-10-CM

## 2014-11-20 DIAGNOSIS — Z1239 Encounter for other screening for malignant neoplasm of breast: Secondary | ICD-10-CM

## 2014-11-20 DIAGNOSIS — D649 Anemia, unspecified: Secondary | ICD-10-CM | POA: Diagnosis not present

## 2014-11-20 DIAGNOSIS — Z800 Family history of malignant neoplasm of digestive organs: Secondary | ICD-10-CM

## 2014-11-20 LAB — COMPREHENSIVE METABOLIC PANEL
ALT: 12 U/L (ref 0–35)
AST: 24 U/L (ref 0–37)
Albumin: 4.2 g/dL (ref 3.5–5.2)
Alkaline Phosphatase: 38 U/L — ABNORMAL LOW (ref 39–117)
BUN: 12 mg/dL (ref 6–23)
CHLORIDE: 104 meq/L (ref 96–112)
CO2: 27 mEq/L (ref 19–32)
CREATININE: 0.68 mg/dL (ref 0.40–1.20)
Calcium: 9.6 mg/dL (ref 8.4–10.5)
GFR: 93.09 mL/min (ref >60.00)
Glucose, Bld: 84 mg/dL (ref 70–99)
Potassium: 4.7 mEq/L (ref 3.5–5.1)
SODIUM: 139 meq/L (ref 135–145)
TOTAL PROTEIN: 7.2 g/dL (ref 6.0–8.3)
Total Bilirubin: 0.5 mg/dL (ref 0.2–1.2)

## 2014-11-20 LAB — LIPID PANEL
Cholesterol: 225 mg/dL — ABNORMAL HIGH (ref 0–200)
HDL: 73.8 mg/dL (ref >39.00)
LDL Cholesterol: 138 mg/dL — ABNORMAL HIGH (ref 0–99)
NONHDL: 151.2
Total CHOL/HDL Ratio: 3
Triglycerides: 65 mg/dL (ref 0.0–149.0)
VLDL: 13 mg/dL (ref 0.0–40.0)

## 2014-11-20 LAB — CBC WITH DIFFERENTIAL/PLATELET
Basophils Absolute: 0 10*3/uL (ref 0.0–0.1)
Basophils Relative: 0.4 % (ref 0.0–3.0)
Eosinophils Absolute: 0.4 10*3/uL (ref 0.0–0.7)
Eosinophils Relative: 6.6 % — ABNORMAL HIGH (ref 0.0–5.0)
HCT: 34.2 % — ABNORMAL LOW (ref 36.0–46.0)
Hemoglobin: 11.6 g/dL — ABNORMAL LOW (ref 12.0–15.0)
Lymphocytes Relative: 29.5 % (ref 12.0–46.0)
Lymphs Abs: 1.7 10*3/uL (ref 0.7–4.0)
MCHC: 34.1 g/dL (ref 30.0–36.0)
MCV: 88.8 fl (ref 78.0–100.0)
Monocytes Absolute: 0.7 10*3/uL (ref 0.1–1.0)
Monocytes Relative: 12.6 % — ABNORMAL HIGH (ref 3.0–12.0)
NEUTROS ABS: 2.9 10*3/uL (ref 1.4–7.7)
Neutrophils Relative %: 50.9 % (ref 43.0–77.0)
Platelets: 414 10*3/uL — ABNORMAL HIGH (ref 150.0–400.0)
RBC: 3.85 Mil/uL — ABNORMAL LOW (ref 3.87–5.11)
RDW: 12.9 % (ref 11.5–15.5)
WBC: 5.7 10*3/uL (ref 4.0–10.5)

## 2014-11-20 LAB — TSH: TSH: 1.09 u[IU]/mL (ref 0.35–4.50)

## 2014-11-20 LAB — FERRITIN: Ferritin: 29.8 ng/mL (ref 10.0–291.0)
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
## 2014-11-20 NOTE — Progress Notes (Signed)
Pre visit review using our clinic review tool, if applicable. No additional management support is needed unless otherwise documented below in the visit note. 

## 2014-11-21 ENCOUNTER — Encounter: Payer: Self-pay | Admitting: Internal Medicine

## 2014-11-21 ENCOUNTER — Telehealth: Payer: Self-pay | Admitting: Internal Medicine

## 2014-11-21 DIAGNOSIS — D75839 Thrombocytosis, unspecified: Secondary | ICD-10-CM

## 2014-11-21 DIAGNOSIS — D649 Anemia, unspecified: Secondary | ICD-10-CM

## 2014-11-21 DIAGNOSIS — D473 Essential (hemorrhagic) thrombocythemia: Secondary | ICD-10-CM

## 2014-11-21 NOTE — Telephone Encounter (Signed)
Pt was notified of labs via my chart.  Needs a f/u non fasting lab in 2 weeks.  Please schedule an appt and contact her with appt date and time.  Thanks.

## 2014-11-24 ENCOUNTER — Encounter: Payer: Self-pay | Admitting: Internal Medicine

## 2014-11-24 NOTE — Assessment & Plan Note (Signed)
Sees dermatology.  Check cxr.

## 2014-11-24 NOTE — Assessment & Plan Note (Signed)
Colonoscopy 10/11/13.  Recommended f/u in 3 years.  Due 2018.

## 2014-11-24 NOTE — Assessment & Plan Note (Signed)
S/p cryosurgery.  Last pap 09/2012 - ok.

## 2014-11-24 NOTE — Assessment & Plan Note (Signed)
Had EGD and colonoscopy 10/11/13 as outlined.  Recheck cbc.  Not taking iron supplements.

## 2014-12-05 ENCOUNTER — Other Ambulatory Visit: Payer: BLUE CROSS/BLUE SHIELD

## 2014-12-09 ENCOUNTER — Other Ambulatory Visit (INDEPENDENT_AMBULATORY_CARE_PROVIDER_SITE_OTHER): Payer: BLUE CROSS/BLUE SHIELD

## 2014-12-09 DIAGNOSIS — D75839 Thrombocytosis, unspecified: Secondary | ICD-10-CM

## 2014-12-09 DIAGNOSIS — D473 Essential (hemorrhagic) thrombocythemia: Secondary | ICD-10-CM

## 2014-12-09 DIAGNOSIS — D649 Anemia, unspecified: Secondary | ICD-10-CM | POA: Diagnosis not present

## 2014-12-09 LAB — CBC WITH DIFFERENTIAL/PLATELET
BASOS PCT: 0.4 % (ref 0.0–3.0)
Basophils Absolute: 0 10*3/uL (ref 0.0–0.1)
Eosinophils Absolute: 0.3 10*3/uL (ref 0.0–0.7)
Eosinophils Relative: 4 % (ref 0.0–5.0)
HCT: 32.8 % — ABNORMAL LOW (ref 36.0–46.0)
Hemoglobin: 11 g/dL — ABNORMAL LOW (ref 12.0–15.0)
LYMPHS PCT: 29 % (ref 12.0–46.0)
Lymphs Abs: 1.8 10*3/uL (ref 0.7–4.0)
MCHC: 33.7 g/dL (ref 30.0–36.0)
MCV: 89.3 fl (ref 78.0–100.0)
Monocytes Absolute: 0.6 10*3/uL (ref 0.1–1.0)
Monocytes Relative: 9.9 % (ref 3.0–12.0)
NEUTROS PCT: 56.7 % (ref 43.0–77.0)
Neutro Abs: 3.6 10*3/uL (ref 1.4–7.7)
PLATELETS: 380 10*3/uL (ref 150.0–400.0)
RBC: 3.67 Mil/uL — ABNORMAL LOW (ref 3.87–5.11)
RDW: 13 % (ref 11.5–15.5)
WBC: 6.3 10*3/uL (ref 4.0–10.5)

## 2014-12-09 LAB — IBC PANEL
Iron: 120 ug/dL (ref 42–145)
Saturation Ratios: 35 % (ref 20.0–50.0)
Transferrin: 245 mg/dL (ref 212.0–360.0)

## 2014-12-10 ENCOUNTER — Encounter: Payer: Self-pay | Admitting: Internal Medicine

## 2015-02-12 ENCOUNTER — Other Ambulatory Visit: Payer: Self-pay | Admitting: Internal Medicine

## 2015-02-12 ENCOUNTER — Ambulatory Visit
Admission: RE | Admit: 2015-02-12 | Discharge: 2015-02-12 | Disposition: A | Discharge status: Home / Self Care | Payer: BLUE CROSS/BLUE SHIELD | Source: Ambulatory Visit | Attending: Internal Medicine | Admitting: Internal Medicine

## 2015-02-12 DIAGNOSIS — Z1239 Encounter for other screening for malignant neoplasm of breast: Secondary | ICD-10-CM

## 2015-02-12 DIAGNOSIS — Z1231 Encounter for screening mammogram for malignant neoplasm of breast: Secondary | ICD-10-CM | POA: Diagnosis present

## 2015-02-12 DIAGNOSIS — Z9882 Breast implant status: Secondary | ICD-10-CM | POA: Insufficient documentation

## 2015-02-12 HISTORY — DX: Malignant (primary) neoplasm, unspecified: C80.1

## 2015-02-13 ENCOUNTER — Encounter: Payer: Self-pay | Admitting: Internal Medicine

## 2015-02-13 DIAGNOSIS — Z0000 Encounter for general adult medical examination without abnormal findings: Secondary | ICD-10-CM | POA: Insufficient documentation

## 2015-02-27 ENCOUNTER — Ambulatory Visit (INDEPENDENT_AMBULATORY_CARE_PROVIDER_SITE_OTHER)
Admission: RE | Admit: 2015-02-27 | Discharge: 2015-02-27 | Disposition: A | Discharge status: Home / Self Care | Payer: BLUE CROSS/BLUE SHIELD | Source: Ambulatory Visit | Attending: Internal Medicine | Admitting: Internal Medicine

## 2015-02-27 ENCOUNTER — Encounter: Payer: Self-pay | Admitting: Internal Medicine

## 2015-02-27 DIAGNOSIS — C439 Malignant melanoma of skin, unspecified: Secondary | ICD-10-CM

## 2015-02-28 ENCOUNTER — Telehealth: Payer: Self-pay | Admitting: Internal Medicine

## 2015-02-28 NOTE — Telephone Encounter (Signed)
Results placed in Dr. Bary Leriche box

## 2015-02-28 NOTE — Telephone Encounter (Signed)
Pt has contacted my twice about carotid ultrasound results.  She was referred to [facility].  Had carotid ultrasound.  Need results.  Please hold until we receive.  Thanks    Dr Nicki Reaper

## 2015-02-28 NOTE — Telephone Encounter (Signed)
Results requested

## 2015-03-02 NOTE — Telephone Encounter (Signed)
Please notify pt that her carotid ultrasound is ok.

## 2015-03-03 NOTE — Telephone Encounter (Signed)
Sent my chart with results. 

## 2015-04-30 ENCOUNTER — Other Ambulatory Visit: Payer: Self-pay | Admitting: Internal Medicine

## 2015-04-30 MED ORDER — ALPRAZOLAM 0.25 MG PO TABS: 0.2500 mg | ORAL_TABLET | Freq: Every day | ORAL | Status: DC | PRN

## 2015-04-30 NOTE — Progress Notes (Signed)
Opened in error

## 2015-05-01 ENCOUNTER — Encounter: Payer: Self-pay | Admitting: Internal Medicine

## 2015-05-23 ENCOUNTER — Encounter: Payer: Self-pay | Admitting: Internal Medicine

## 2015-05-23 ENCOUNTER — Ambulatory Visit (INDEPENDENT_AMBULATORY_CARE_PROVIDER_SITE_OTHER): Payer: BLUE CROSS/BLUE SHIELD | Admitting: Internal Medicine

## 2015-05-23 VITALS — BP 106/64 | HR 68 | Temp 98.2°F | Resp 14 | Ht 65.0 in | Wt 131.8 lb

## 2015-05-23 DIAGNOSIS — R87619 Unspecified abnormal cytological findings in specimens from cervix uteri: Secondary | ICD-10-CM | POA: Diagnosis not present

## 2015-05-23 DIAGNOSIS — Z658 Other specified problems related to psychosocial circumstances: Secondary | ICD-10-CM

## 2015-05-23 DIAGNOSIS — D649 Anemia, unspecified: Secondary | ICD-10-CM

## 2015-05-23 DIAGNOSIS — C439 Malignant melanoma of skin, unspecified: Secondary | ICD-10-CM

## 2015-05-23 DIAGNOSIS — R319 Hematuria, unspecified: Secondary | ICD-10-CM

## 2015-05-23 DIAGNOSIS — F439 Reaction to severe stress, unspecified: Secondary | ICD-10-CM

## 2015-05-23 DIAGNOSIS — Z800 Family history of malignant neoplasm of digestive organs: Secondary | ICD-10-CM | POA: Diagnosis not present

## 2015-05-23 DIAGNOSIS — E7800 Pure hypercholesterolemia, unspecified: Secondary | ICD-10-CM

## 2015-05-23 MED ORDER — ALPRAZOLAM 0.25 MG PO TABS: 0.2500 mg | ORAL_TABLET | Freq: Every day | ORAL | Status: DC | PRN
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
## 2015-05-23 NOTE — Progress Notes (Signed)
Pre visit review using our clinic review tool, if applicable. No additional management support is needed unless otherwise documented below in the visit note. 

## 2015-05-24 ENCOUNTER — Encounter: Payer: Self-pay | Admitting: Internal Medicine

## 2015-05-24 DIAGNOSIS — F439 Reaction to severe stress, unspecified: Secondary | ICD-10-CM | POA: Insufficient documentation

## 2015-05-24 NOTE — Assessment & Plan Note (Signed)
Has been worked up by urology (Dr Jacqlyn Larsen) - mild congenital UPJ obstruction.  Last urinalysis - no rbc's.

## 2015-05-24 NOTE — Assessment & Plan Note (Signed)
Increased stress as outlined.  Discussed at length with her today.  Discussed various treatment options.  Follow closely.  Will notify me if she needs something more.

## 2015-05-24 NOTE — Assessment & Plan Note (Signed)
Sees dermatology.  cxr 02/27/15 - no active lung disease.

## 2015-05-24 NOTE — Assessment & Plan Note (Signed)
S/p cryosurgery.  Last pap 09/2012 - ok.   

## 2015-05-24 NOTE — Assessment & Plan Note (Signed)
EGD and colonoscopy 10/11/13.  EGD with fundic gland polyp.  Chronic inflammation of cardiac-type gastric mucosa.  Mild reflux changes.  Colonoscopy - sessile serrated adenoma and tubular adnema, diverticulosis and internal hemorrhoids.  Follow cbc.  Hold labs today.  She will call to schedule fasting labs.

## 2015-05-24 NOTE — Assessment & Plan Note (Signed)
Colonoscopy in 10/11/13.  Recommended f/u colonoscopy in 2018.

## 2015-07-21 ENCOUNTER — Encounter: Payer: Self-pay | Admitting: Internal Medicine

## 2015-07-21 ENCOUNTER — Ambulatory Visit (INDEPENDENT_AMBULATORY_CARE_PROVIDER_SITE_OTHER): Payer: BLUE CROSS/BLUE SHIELD | Admitting: Internal Medicine

## 2015-07-21 VITALS — BP 100/80 | HR 67 | Temp 98.4°F | Resp 17 | Ht 65.0 in | Wt 130.5 lb

## 2015-07-21 DIAGNOSIS — Z658 Other specified problems related to psychosocial circumstances: Secondary | ICD-10-CM

## 2015-07-21 DIAGNOSIS — C439 Malignant melanoma of skin, unspecified: Secondary | ICD-10-CM | POA: Diagnosis not present

## 2015-07-21 DIAGNOSIS — Z800 Family history of malignant neoplasm of digestive organs: Secondary | ICD-10-CM | POA: Diagnosis not present

## 2015-07-21 DIAGNOSIS — D649 Anemia, unspecified: Secondary | ICD-10-CM

## 2015-07-21 DIAGNOSIS — F439 Reaction to severe stress, unspecified: Secondary | ICD-10-CM

## 2015-07-21 MED ORDER — LEVOFLOXACIN 0.5 % OP SOLN: 1.0000 [drp] | Freq: Four times a day (QID) | OPHTHALMIC | Status: DC

## 2015-07-21 NOTE — Progress Notes (Signed)
Pre-visit discussion using our clinic review tool. No additional management support is needed unless otherwise documented below in the visit note.  
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
## 2015-07-22 ENCOUNTER — Encounter: Payer: Self-pay | Admitting: Internal Medicine

## 2015-07-22 NOTE — Assessment & Plan Note (Signed)
Followed by dermatology.  02/27/15 - cxr - ok.

## 2015-07-22 NOTE — Assessment & Plan Note (Signed)
Colonoscopy 10/11/13.  Recommended f/u colonoscopy in 2018.

## 2015-07-22 NOTE — Assessment & Plan Note (Signed)
Increased stress as outlined.  Discussed with her today.  She is doing better.  Desires no further intervention at this time.  Follow.

## 2015-07-22 NOTE — Assessment & Plan Note (Signed)
Has had EGD and colonoscopy.  Follow cbc.   

## 2015-09-23 ENCOUNTER — Encounter: Payer: Self-pay | Admitting: Internal Medicine

## 2015-09-26 ENCOUNTER — Ambulatory Visit: Payer: BLUE CROSS/BLUE SHIELD | Admitting: Internal Medicine

## 2015-10-14 ENCOUNTER — Ambulatory Visit (INDEPENDENT_AMBULATORY_CARE_PROVIDER_SITE_OTHER): Payer: BLUE CROSS/BLUE SHIELD | Admitting: Internal Medicine

## 2015-10-14 ENCOUNTER — Encounter: Payer: Self-pay | Admitting: Internal Medicine

## 2015-10-14 VITALS — BP 110/80 | HR 66 | Temp 97.9°F | Resp 18 | Ht 65.0 in | Wt 130.4 lb

## 2015-10-14 DIAGNOSIS — C439 Malignant melanoma of skin, unspecified: Secondary | ICD-10-CM

## 2015-10-14 DIAGNOSIS — Z658 Other specified problems related to psychosocial circumstances: Secondary | ICD-10-CM

## 2015-10-14 DIAGNOSIS — D649 Anemia, unspecified: Secondary | ICD-10-CM | POA: Diagnosis not present

## 2015-10-14 DIAGNOSIS — F439 Reaction to severe stress, unspecified: Secondary | ICD-10-CM
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
## 2015-10-14 NOTE — Progress Notes (Signed)
Pre-visit discussion using our clinic review tool. No additional management support is needed unless otherwise documented below in the visit note.  

## 2015-10-19 ENCOUNTER — Encounter: Payer: Self-pay | Admitting: Internal Medicine

## 2015-10-19 NOTE — Assessment & Plan Note (Signed)
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  Follow.

## 2015-10-19 NOTE — Assessment & Plan Note (Signed)
Followed by dermatology.  02/27/15 - cxr - ok.   

## 2015-10-19 NOTE — Assessment & Plan Note (Signed)
Follow cbc.  Has had EGD and colonoscopy.

## 2015-12-01 ENCOUNTER — Encounter: Payer: Self-pay | Admitting: Internal Medicine

## 2015-12-01 NOTE — Telephone Encounter (Signed)
Please call Julie Porter and notify her that her daughter will need to be seen if she is having increased anxiety.  Also, need to clarify if her daughter's therapist can prescribe medication.  If so, can f/u with therapist.  If not, I need to see her in the office.  Can schedule an appt 12:00 on Thursday 12/04/15.  Also, what does she (Julie Porter) need meloxicam for?

## 2015-12-02 NOTE — Telephone Encounter (Signed)
Left pt message to check mychart. See above for response

## 2015-12-03 MED ORDER — MELOXICAM 7.5 MG PO TABS: 7.5000 mg | ORAL_TABLET | Freq: Every day | ORAL | Status: DC | PRN

## 2015-12-03 NOTE — Telephone Encounter (Signed)
Sent in rx for meloxicam #20 with no refills.

## 2016-01-07 ENCOUNTER — Other Ambulatory Visit (INDEPENDENT_AMBULATORY_CARE_PROVIDER_SITE_OTHER): Payer: BLUE CROSS/BLUE SHIELD

## 2016-01-07 DIAGNOSIS — F439 Reaction to severe stress, unspecified: Secondary | ICD-10-CM

## 2016-01-07 DIAGNOSIS — E7800 Pure hypercholesterolemia, unspecified: Secondary | ICD-10-CM | POA: Diagnosis not present

## 2016-01-07 DIAGNOSIS — C439 Malignant melanoma of skin, unspecified: Secondary | ICD-10-CM | POA: Diagnosis not present

## 2016-01-07 DIAGNOSIS — Z658 Other specified problems related to psychosocial circumstances: Secondary | ICD-10-CM | POA: Diagnosis not present

## 2016-01-07 DIAGNOSIS — D649 Anemia, unspecified: Secondary | ICD-10-CM | POA: Diagnosis not present

## 2016-01-07 LAB — CBC WITH DIFFERENTIAL/PLATELET
BASOS ABS: 0 10*3/uL (ref 0.0–0.1)
Basophils Relative: 0.3 % (ref 0.0–3.0)
Eosinophils Absolute: 0.3 10*3/uL (ref 0.0–0.7)
Eosinophils Relative: 4.9 % (ref 0.0–5.0)
HEMATOCRIT: 33.7 % — AB (ref 36.0–46.0)
HEMOGLOBIN: 11.3 g/dL — AB (ref 12.0–15.0)
LYMPHS PCT: 34.3 % (ref 12.0–46.0)
Lymphs Abs: 2.1 10*3/uL (ref 0.7–4.0)
MCHC: 33.6 g/dL (ref 30.0–36.0)
MCV: 90.6 fl (ref 78.0–100.0)
MONOS PCT: 11 % (ref 3.0–12.0)
Monocytes Absolute: 0.7 10*3/uL (ref 0.1–1.0)
Neutro Abs: 3 10*3/uL (ref 1.4–7.7)
Neutrophils Relative %: 49.5 % (ref 43.0–77.0)
Platelets: 436 10*3/uL — ABNORMAL HIGH (ref 150.0–400.0)
RBC: 3.72 Mil/uL — AB (ref 3.87–5.11)
RDW: 13.9 % (ref 11.5–15.5)
WBC: 6.1 10*3/uL (ref 4.0–10.5)

## 2016-01-07 LAB — COMPREHENSIVE METABOLIC PANEL
ALBUMIN: 4 g/dL (ref 3.5–5.2)
ALT: 18 U/L (ref 0–35)
AST: 27 U/L (ref 0–37)
Alkaline Phosphatase: 36 U/L — ABNORMAL LOW (ref 39–117)
BUN: 13 mg/dL (ref 6–23)
CALCIUM: 9.5 mg/dL (ref 8.4–10.5)
CHLORIDE: 104 meq/L (ref 96–112)
CO2: 30 mEq/L (ref 19–32)
Creatinine, Ser: 0.69 mg/dL (ref 0.40–1.20)
GFR: 91.2 mL/min (ref >60.00)
Glucose, Bld: 83 mg/dL (ref 70–99)
POTASSIUM: 4.7 meq/L (ref 3.5–5.1)
SODIUM: 140 meq/L (ref 135–145)
Total Bilirubin: 0.4 mg/dL (ref 0.2–1.2)
Total Protein: 7.3 g/dL (ref 6.0–8.3)

## 2016-01-07 LAB — LIPID PANEL
Cholesterol: 208 mg/dL — ABNORMAL HIGH (ref 0–200)
HDL: 81.3 mg/dL (ref >39.00)
LDL CALC: 116 mg/dL — AB (ref 0–99)
NonHDL: 126.96
TRIGLYCERIDES: 54 mg/dL (ref 0.0–149.0)
Total CHOL/HDL Ratio: 3
VLDL: 10.8 mg/dL (ref 0.0–40.0)

## 2016-01-07 LAB — TSH: TSH: 1.61 u[IU]/mL (ref 0.35–4.50)

## 2016-01-07 LAB — FERRITIN: Ferritin: 18.2 ng/mL (ref 10.0–291.0)

## 2016-01-08 ENCOUNTER — Encounter: Payer: Self-pay | Admitting: Internal Medicine

## 2016-01-15 ENCOUNTER — Encounter: Payer: BLUE CROSS/BLUE SHIELD | Admitting: Internal Medicine

## 2016-02-11 ENCOUNTER — Ambulatory Visit (INDEPENDENT_AMBULATORY_CARE_PROVIDER_SITE_OTHER): Payer: BLUE CROSS/BLUE SHIELD | Admitting: Internal Medicine

## 2016-02-11 ENCOUNTER — Other Ambulatory Visit (HOSPITAL_COMMUNITY)
Admission: RE | Admit: 2016-02-11 | Discharge: 2016-02-11 | Disposition: A | Discharge status: Home / Self Care | Payer: BLUE CROSS/BLUE SHIELD | Source: Ambulatory Visit | Attending: Internal Medicine | Admitting: Internal Medicine

## 2016-02-11 ENCOUNTER — Encounter: Payer: Self-pay | Admitting: Internal Medicine

## 2016-02-11 VITALS — BP 110/74 | HR 64 | Temp 98.6°F | Ht 65.25 in | Wt 132.0 lb

## 2016-02-11 DIAGNOSIS — Z1239 Encounter for other screening for malignant neoplasm of breast: Secondary | ICD-10-CM

## 2016-02-11 DIAGNOSIS — D649 Anemia, unspecified: Secondary | ICD-10-CM

## 2016-02-11 DIAGNOSIS — Z124 Encounter for screening for malignant neoplasm of cervix: Secondary | ICD-10-CM | POA: Diagnosis not present

## 2016-02-11 DIAGNOSIS — Z1151 Encounter for screening for human papillomavirus (HPV): Secondary | ICD-10-CM | POA: Diagnosis present

## 2016-02-11 DIAGNOSIS — C439 Malignant melanoma of skin, unspecified: Secondary | ICD-10-CM

## 2016-02-11 DIAGNOSIS — Z0000 Encounter for general adult medical examination without abnormal findings: Secondary | ICD-10-CM | POA: Diagnosis not present

## 2016-02-11 DIAGNOSIS — Z01419 Encounter for gynecological examination (general) (routine) without abnormal findings: Secondary | ICD-10-CM | POA: Insufficient documentation

## 2016-02-11 DIAGNOSIS — Z800 Family history of malignant neoplasm of digestive organs: Secondary | ICD-10-CM

## 2016-02-11 DIAGNOSIS — R87619 Unspecified abnormal cytological findings in specimens from cervix uteri: Secondary | ICD-10-CM

## 2016-02-11 DIAGNOSIS — F439 Reaction to severe stress, unspecified: Secondary | ICD-10-CM

## 2016-02-11 DIAGNOSIS — Z658 Other specified problems related to psychosocial circumstances: Secondary | ICD-10-CM
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
## 2016-02-11 NOTE — Progress Notes (Signed)
Pre visit review using our clinic review tool, if applicable. No additional management support is needed unless otherwise documented below in the visit note. 

## 2016-02-11 NOTE — Assessment & Plan Note (Addendum)
Physical today 02/11/16.  PAP 02/11/16.   Colonoscopy 10/11/13.  Due f/u colonoscopy in 09/2016.

## 2016-02-12 LAB — CYTOLOGY - PAP

## 2016-02-13 ENCOUNTER — Encounter: Payer: Self-pay | Admitting: Internal Medicine

## 2016-02-15 ENCOUNTER — Encounter: Payer: Self-pay | Admitting: Internal Medicine

## 2016-02-15 NOTE — Assessment & Plan Note (Signed)
Colonoscopy 10/11/13.  Recommended f/u colonoscopy in 09/2016.

## 2016-02-15 NOTE — Assessment & Plan Note (Signed)
Followed by dermatology.  02/27/15 cxr ok.

## 2016-02-15 NOTE — Assessment & Plan Note (Signed)
Hgb just checked 11.3.  Follow cbc.  Had EGD and colonoscopy.

## 2016-02-15 NOTE — Assessment & Plan Note (Signed)
Discussed with her today.  Feels she is handling things relatively well.  Does not feel needs anything more at this point.  Follow.

## 2016-02-15 NOTE — Assessment & Plan Note (Signed)
S/p cryosurgery.  PAP today.

## 2016-03-18 ENCOUNTER — Other Ambulatory Visit: Payer: Self-pay | Admitting: Internal Medicine

## 2016-03-18 ENCOUNTER — Ambulatory Visit
Admission: RE | Admit: 2016-03-18 | Discharge: 2016-03-18 | Disposition: A | Discharge status: Home / Self Care | Payer: BLUE CROSS/BLUE SHIELD | Source: Ambulatory Visit | Attending: Internal Medicine | Admitting: Internal Medicine

## 2016-03-18 DIAGNOSIS — Z9882 Breast implant status: Secondary | ICD-10-CM | POA: Insufficient documentation

## 2016-03-18 DIAGNOSIS — Z1231 Encounter for screening mammogram for malignant neoplasm of breast: Secondary | ICD-10-CM | POA: Diagnosis not present

## 2016-03-18 DIAGNOSIS — Z1239 Encounter for other screening for malignant neoplasm of breast: Secondary | ICD-10-CM

## 2016-05-13 ENCOUNTER — Encounter: Payer: Self-pay | Admitting: Internal Medicine

## 2016-05-13 ENCOUNTER — Ambulatory Visit (INDEPENDENT_AMBULATORY_CARE_PROVIDER_SITE_OTHER): Payer: BLUE CROSS/BLUE SHIELD | Admitting: Internal Medicine

## 2016-05-13 DIAGNOSIS — F439 Reaction to severe stress, unspecified: Secondary | ICD-10-CM

## 2016-05-13 DIAGNOSIS — C439 Malignant melanoma of skin, unspecified: Secondary | ICD-10-CM | POA: Diagnosis not present

## 2016-05-13 DIAGNOSIS — Z800 Family history of malignant neoplasm of digestive organs: Secondary | ICD-10-CM

## 2016-05-13 DIAGNOSIS — D649 Anemia, unspecified: Secondary | ICD-10-CM

## 2016-05-13 DIAGNOSIS — C44301 Unspecified malignant neoplasm of skin of nose: Secondary | ICD-10-CM

## 2016-05-13 DIAGNOSIS — Z658 Other specified problems related to psychosocial circumstances: Secondary | ICD-10-CM | POA: Diagnosis not present
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
## 2016-05-15 ENCOUNTER — Encounter: Payer: Self-pay | Admitting: Internal Medicine

## 2016-05-15 DIAGNOSIS — C44301 Unspecified malignant neoplasm of skin of nose: Secondary | ICD-10-CM | POA: Insufficient documentation

## 2016-05-15 NOTE — Assessment & Plan Note (Signed)
She could not hear, so I removed her packing.  She will need to f/uw with ENT

## 2016-05-15 NOTE — Assessment & Plan Note (Signed)
Has had GI w/up.  Follow cbc.  

## 2016-05-15 NOTE — Assessment & Plan Note (Signed)
Followed by dermatology

## 2016-05-15 NOTE — Assessment & Plan Note (Signed)
Increased stress as outlined.  Discussed with her today.  She feels overall things are relatively stable.  Follow.

## 2016-05-15 NOTE — Assessment & Plan Note (Signed)
Had colonoscopy 1/29//15.  Recommended f/u colonoscopy in 09/2016/.

## 2016-05-26 ENCOUNTER — Encounter: Payer: Self-pay | Admitting: Internal Medicine

## 2016-05-31 ENCOUNTER — Ambulatory Visit (INDEPENDENT_AMBULATORY_CARE_PROVIDER_SITE_OTHER): Payer: BLUE CROSS/BLUE SHIELD | Admitting: Family Medicine

## 2016-05-31 ENCOUNTER — Encounter: Payer: Self-pay | Admitting: Family Medicine

## 2016-05-31 DIAGNOSIS — H66001 Acute suppurative otitis media without spontaneous rupture of ear drum, right ear: Secondary | ICD-10-CM

## 2016-05-31 DIAGNOSIS — H6690 Otitis media, unspecified, unspecified ear: Secondary | ICD-10-CM | POA: Insufficient documentation

## 2016-05-31 MED ORDER — AZITHROMYCIN 250 MG PO TABS: ORAL_TABLET | ORAL | 0 refills | Status: DC

## 2016-05-31 NOTE — Patient Instructions (Addendum)
Nice to meet you. It appears that she have an ear infection. We will treat with azithromycin given your penicillin allergy. I will see you back in 1 week for recheck. If you develop fevers, persistent drainage from the ear, or any new or changing symptoms please seek medical attention.

## 2016-05-31 NOTE — Progress Notes (Signed)
  Tommi Rumps, MD Phone: [phone]  Julie Porter is a 65 y.o. female who presents today for same-day visit.  Patient notes for the last several days she's had some right ear discomfort. Notes it drained some fluid recently. Feels full. Notes she recently had a skin cancer removed from her nose and they took skin grafts from her right ear and she feels as though the fluid was related to the oozing from the graft sites. She notes it is possibly better. She's tried over-the-counter earwax removal liquid and this has not been beneficial. Versed. No other upper respiratory symptoms.  ROS see history of present illness  Objective  Physical Exam Vitals:   05/31/16 1308  BP: 112/70  Pulse: 70  Temp: 98.6 F (37 C)    BP Readings from Last 3 Encounters:  05/31/16 112/70  05/13/16 118/84  02/11/16 110/74   Wt Readings from Last 3 Encounters:  05/31/16 (P) 137 lb (62.1 kg)  05/13/16 137 lb (62.1 kg)  02/11/16 132 lb (59.9 kg)    Physical Exam  Constitutional: No distress.  HENT:  Head: Normocephalic and atraumatic.  Left Ear: External ear normal.  Ears:  Mouth/Throat: Oropharynx is clear and moist. No oropharyngeal exudate.  Right TM erythematous and with purulent material, no obvious perforation  Cardiovascular: Normal rate, regular rhythm and normal heart sounds.   Pulmonary/Chest: Effort normal and breath sounds normal.  Neurological: She is alert. Gait normal.  Skin: Skin is warm and dry. She is not diaphoretic.     Assessment/Plan: Please see individual problem list.  Otitis media Patient's symptoms and exam consistent with otitis media. There is no obvious perforation on exam though the history patient provides is concerning for perforation. Given her hives with penicillin we will treat with azithromycin. She will follow-up in one week for recheck of her ear and if no improvement or if there is evidence of a perforation at that time we will refer to ENT. Given  return precautions.   No orders of the defined types were placed in this encounter.   Meds ordered this encounter  Medications  . azithromycin (ZITHROMAX) 250 MG tablet    Sig: Take 500 mg (2 tablets) by mouth today, then take 250 mg (one tablet) by mouth daily for 4 days    Dispense:  6 tablet    Refill:  0    Tommi Rumps, MD [place]

## 2016-05-31 NOTE — Assessment & Plan Note (Signed)
Patient's symptoms and exam consistent with otitis media. There is no obvious perforation on exam though the history patient provides is concerning for perforation. Given her hives with penicillin we will treat with azithromycin. She will follow-up in one week for recheck of her ear and if no improvement or if there is evidence of a perforation at that time we will refer to ENT. Given return precautions.

## 2016-06-10 ENCOUNTER — Ambulatory Visit (INDEPENDENT_AMBULATORY_CARE_PROVIDER_SITE_OTHER): Payer: BLUE CROSS/BLUE SHIELD | Admitting: Family Medicine

## 2016-06-10 DIAGNOSIS — H66001 Acute suppurative otitis media without spontaneous rupture of ear drum, right ear: Secondary | ICD-10-CM | POA: Diagnosis not present

## 2016-06-10 MED ORDER — AZITHROMYCIN 250 MG PO TABS: ORAL_TABLET | ORAL | 0 refills | Status: DC

## 2016-06-10 NOTE — Progress Notes (Signed)
  Tommi Rumps, MD Phone: [phone]  Julie Porter is a 65 y.o. female who presents today for follow-up.  Patient presents in follow-up for right otitis media. She finished a Z-Pak 2 days ago. Notes her hearing is improved. Less pressure in her ear. No upper respiratory symptoms. No fevers.  ROS see history of present illness  Objective  Physical Exam Vitals:   06/10/16 0819  BP: 108/66  Pulse: 77  Temp: 98.2 F (36.8 C)    BP Readings from Last 3 Encounters:  06/10/16 108/66  05/31/16 112/70  05/13/16 118/84   Wt Readings from Last 3 Encounters:  06/10/16 137 lb 9.6 oz (62.4 kg)  05/31/16 (P) 137 lb (62.1 kg)  05/13/16 137 lb (62.1 kg)    Physical Exam  Constitutional: No distress.  HENT:  Head: Normocephalic and atraumatic.  Mouth/Throat: Oropharynx is clear and moist. No oropharyngeal exudate.  Left TM normal, right TM with moderately improved erythema though still has erythema present, there is less purulent material behind the TM vein and previously, in the right upper corner of the TM there appears to be an irregularity though it is difficult to tell if this is a perforation, there is some moist material in the ear canal though no erythema noted of the ear canal  Eyes: Conjunctivae are normal. Pupils are equal, round, and reactive to light.  Pulmonary/Chest: Effort normal.  Skin: She is not diaphoretic.     Assessment/Plan: Please see individual problem list.  Otitis media Patient with findings continuing to be consistent with otitis media. There is possible perforation though it is difficult to tell on exam. Discussed options with patient and we opted to extend her course of antibiotics for another 5 days and recheck next week. If not improving at that time we'll refer to ENT for further evaluation.   No orders of the defined types were placed in this encounter.   Meds ordered this encounter  Medications  . azithromycin (ZITHROMAX) 250 MG tablet    Sig: Take 500 mg (2 tablets) by mouth today, then take 250 mg (one tablet) by mouth daily for 4 days    Dispense:  6 tablet    Refill:  0   Tommi Rumps, MD [place]

## 2016-06-10 NOTE — Progress Notes (Signed)
Pre visit review using our clinic review tool, if applicable. No additional management support is needed unless otherwise documented below in the visit note. 

## 2016-06-10 NOTE — Assessment & Plan Note (Signed)
Patient with findings continuing to be consistent with otitis media. There is possible perforation though it is difficult to tell on exam. Discussed options with patient and we opted to extend her course of antibiotics for another 5 days and recheck next week. If not improving at that time we'll refer to ENT for further evaluation.

## 2016-06-10 NOTE — Patient Instructions (Signed)
Nice to see you again. We will have you do another course of azithromycin and recheck here next week. If not improved at that time we'll refer you to ear nose and throat for evaluation. If you develop fevers or any change in symptoms please seek medical attention.

## 2016-06-17 ENCOUNTER — Ambulatory Visit (INDEPENDENT_AMBULATORY_CARE_PROVIDER_SITE_OTHER): Payer: BLUE CROSS/BLUE SHIELD | Admitting: Family Medicine

## 2016-06-17 ENCOUNTER — Encounter: Payer: Self-pay | Admitting: Family Medicine

## 2016-06-17 DIAGNOSIS — H66001 Acute suppurative otitis media without spontaneous rupture of ear drum, right ear: Secondary | ICD-10-CM | POA: Diagnosis not present

## 2016-06-17 NOTE — Patient Instructions (Signed)
Nice to see you. Your eardrum looks significantly improved. Monitor for recurrent symptoms and if these occur please let us know. If you develop ear pain, fevers, hearing loss, or any new or change in symptoms please seek medical attention.

## 2016-06-17 NOTE — Assessment & Plan Note (Signed)
Much improved. No signs of infection at this time. Discussed continuing to monitor and if she has recurrence of symptoms letting us know. If symptoms do recur I would favor referral to ENT.

## 2016-06-17 NOTE — Progress Notes (Signed)
  Tommi Rumps, MD Phone: [phone]  Julie Porter is a 65 y.o. female who presents today for follow-up.  Patient seen several times over last several weeks for right otitis media. She's been treated with 2 courses of azithromycin. She returns today. She notes no ear pain. No hearing loss. No ear fullness. No upper respiratory symptoms. She feels as though her ear is normal.   ROS see history of present illness  Objective  Physical Exam Vitals:   06/17/16 0832  BP: 118/76  Pulse: 69  Temp: 98.2 F (36.8 C)    BP Readings from Last 3 Encounters:  06/17/16 118/76  06/10/16 108/66  05/31/16 112/70   Wt Readings from Last 3 Encounters:  06/17/16 136 lb 8 oz (61.9 kg)  06/10/16 137 lb 9.6 oz (62.4 kg)  05/31/16 (P) 137 lb (62.1 kg)    Physical Exam  Constitutional: She is well-developed, well-nourished, and in no distress.  HENT:  Head: Normocephalic and atraumatic.  Left TM normal, right TM minimally erythematous with no purulent fluid behind the TM, no perforation noted, there is very thin cerumen on the left half of the TM, no fluid in the ear canal, no swelling of the ear canal  Pulmonary/Chest: Effort normal.  Neurological: She is alert. Gait normal.     Assessment/Plan: Please see individual problem list.  Otitis media Much improved. No signs of infection at this time. Discussed continuing to monitor and if she has recurrence of symptoms letting us know. If symptoms do recur I would favor referral to ENT.  Tommi Rumps, MD [place]

## 2016-07-19 ENCOUNTER — Encounter: Payer: Self-pay | Admitting: Internal Medicine

## 2016-07-19 ENCOUNTER — Ambulatory Visit (INDEPENDENT_AMBULATORY_CARE_PROVIDER_SITE_OTHER): Payer: BLUE CROSS/BLUE SHIELD | Admitting: Internal Medicine

## 2016-07-19 DIAGNOSIS — J029 Acute pharyngitis, unspecified: Secondary | ICD-10-CM

## 2016-07-19 LAB — POCT RAPID STREP A (OFFICE): RAPID STREP A SCREEN: NEGATIVE

## 2016-07-19 MED ORDER — AZITHROMYCIN 250 MG PO TABS: ORAL_TABLET | ORAL | 0 refills | Status: DC
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
## 2016-07-19 NOTE — Progress Notes (Signed)
Pre visit review using our clinic review tool, if applicable. No additional management support is needed unless otherwise documented below in the visit note. 

## 2016-07-21 ENCOUNTER — Encounter: Payer: Self-pay | Admitting: Internal Medicine

## 2016-07-21 LAB — CULTURE, GROUP A STREP: Organism ID, Bacteria: NORMAL

## 2016-07-22 ENCOUNTER — Telehealth: Payer: Self-pay | Admitting: *Deleted

## 2016-07-22 NOTE — Telephone Encounter (Signed)
Patient requested lab results Pt contact [phone]

## 2016-07-22 NOTE — Telephone Encounter (Signed)
Patient states she feels a lot better

## 2016-07-27 ENCOUNTER — Encounter: Payer: Self-pay | Admitting: Internal Medicine

## 2016-07-27 NOTE — Assessment & Plan Note (Signed)
Throat symptoms and exam as outlined.  Rapid strep negative.  Will send culture.  Treat with zpak as directed.  Gargle.  Keep hydrated.  Tylenol as needed.  Follow.  Able to eat and drink.

## 2016-08-09 ENCOUNTER — Encounter: Payer: Self-pay | Admitting: Internal Medicine

## 2016-08-09 ENCOUNTER — Ambulatory Visit (INDEPENDENT_AMBULATORY_CARE_PROVIDER_SITE_OTHER): Payer: BLUE CROSS/BLUE SHIELD | Admitting: Internal Medicine

## 2016-08-09 DIAGNOSIS — F439 Reaction to severe stress, unspecified: Secondary | ICD-10-CM

## 2016-08-09 DIAGNOSIS — R87619 Unspecified abnormal cytological findings in specimens from cervix uteri: Secondary | ICD-10-CM | POA: Diagnosis not present

## 2016-08-09 DIAGNOSIS — D649 Anemia, unspecified: Secondary | ICD-10-CM | POA: Diagnosis not present

## 2016-08-09 DIAGNOSIS — C439 Malignant melanoma of skin, unspecified: Secondary | ICD-10-CM

## 2016-08-09 NOTE — Progress Notes (Signed)
Pre visit review using our clinic review tool, if applicable. No additional management support is needed unless otherwise documented below in the visit note. 
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
## 2016-08-15 ENCOUNTER — Encounter: Payer: Self-pay | Admitting: Internal Medicine

## 2016-08-15 NOTE — Assessment & Plan Note (Signed)
Followed by dermatology

## 2016-08-15 NOTE — Assessment & Plan Note (Signed)
Increased stress as outlined.  Does not feel she needs any further intervention.  Follow.

## 2016-08-15 NOTE — Assessment & Plan Note (Signed)
Has had GI w/up as outlined.  hgb has been stable.  Follow.

## 2016-08-15 NOTE — Assessment & Plan Note (Signed)
S/p cryosurgery.  PAP 01/2016 - negative with negative HPV.

## 2016-08-27 ENCOUNTER — Encounter: Payer: Self-pay | Admitting: Internal Medicine

## 2016-11-11 ENCOUNTER — Ambulatory Visit (INDEPENDENT_AMBULATORY_CARE_PROVIDER_SITE_OTHER): Payer: BLUE CROSS/BLUE SHIELD | Admitting: Internal Medicine

## 2016-11-11 ENCOUNTER — Encounter: Payer: Self-pay | Admitting: Internal Medicine

## 2016-11-11 DIAGNOSIS — F439 Reaction to severe stress, unspecified: Secondary | ICD-10-CM | POA: Diagnosis not present

## 2016-11-11 DIAGNOSIS — C44301 Unspecified malignant neoplasm of skin of nose: Secondary | ICD-10-CM | POA: Diagnosis not present

## 2016-11-11 DIAGNOSIS — C439 Malignant melanoma of skin, unspecified: Secondary | ICD-10-CM | POA: Diagnosis not present

## 2016-11-11 NOTE — Progress Notes (Signed)
Pre-visit discussion using our clinic review tool. No additional management support is needed unless otherwise documented below in the visit note.  
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
## 2016-11-20 ENCOUNTER — Encounter: Payer: Self-pay | Admitting: Internal Medicine

## 2016-11-20 NOTE — Assessment & Plan Note (Signed)
Followed by dermatology

## 2016-11-20 NOTE — Assessment & Plan Note (Signed)
Followed by Dr Verner Chol.

## 2016-11-20 NOTE — Assessment & Plan Note (Signed)
Increased stress as outlined.  Discussed with her today.  She does not feel needs anything more at this time.  Follow.   

## 2017-01-16 ENCOUNTER — Encounter: Payer: Self-pay | Admitting: Internal Medicine

## 2017-01-17 NOTE — Telephone Encounter (Signed)
Patient first noticed on Sunday , small pea sized knot on artery at wrist staed that it pulsates with heartbeat. Denies pain or redness, offered appointment with another provider in office patient stated she did not think was that serious but would rather see PCP with problem.

## 2017-01-17 NOTE — Telephone Encounter (Signed)
Please call and see if pt is having any pain, etc.  How long has she noticed the place on her wrist.  Let her know that I am not in the office and if any acute issues, needs to be seen.  Thanks

## 2017-01-18 NOTE — Telephone Encounter (Signed)
I have open appt - 01/25/17 at 10:30.   Let us know if feel needs to be seen earlier.

## 2017-01-25 ENCOUNTER — Ambulatory Visit (INDEPENDENT_AMBULATORY_CARE_PROVIDER_SITE_OTHER): Payer: BLUE CROSS/BLUE SHIELD | Admitting: Internal Medicine

## 2017-01-25 ENCOUNTER — Encounter: Payer: Self-pay | Admitting: Internal Medicine

## 2017-01-25 VITALS — BP 110/62 | HR 75 | Temp 98.4°F | Resp 12 | Ht 65.0 in | Wt 142.0 lb

## 2017-01-25 DIAGNOSIS — R0989 Other specified symptoms and signs involving the circulatory and respiratory systems: Secondary | ICD-10-CM | POA: Diagnosis not present

## 2017-01-25 DIAGNOSIS — F439 Reaction to severe stress, unspecified: Secondary | ICD-10-CM | POA: Diagnosis not present

## 2017-01-25 DIAGNOSIS — R2232 Localized swelling, mass and lump, left upper limb: Secondary | ICD-10-CM
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
## 2017-01-25 NOTE — Progress Notes (Signed)
Pre-visit discussion using our clinic review tool. No additional management support is needed unless otherwise documented below in the visit note.  

## 2017-01-28 ENCOUNTER — Ambulatory Visit (INDEPENDENT_AMBULATORY_CARE_PROVIDER_SITE_OTHER): Payer: BLUE CROSS/BLUE SHIELD | Admitting: Vascular Surgery

## 2017-01-28 ENCOUNTER — Encounter (INDEPENDENT_AMBULATORY_CARE_PROVIDER_SITE_OTHER): Payer: Self-pay | Admitting: Vascular Surgery

## 2017-01-28 VITALS — BP 115/67 | HR 61 | Resp 15 | Ht 65.0 in | Wt 139.0 lb

## 2017-01-28 DIAGNOSIS — R0989 Other specified symptoms and signs involving the circulatory and respiratory systems: Secondary | ICD-10-CM | POA: Diagnosis not present

## 2017-01-28 DIAGNOSIS — R2232 Localized swelling, mass and lump, left upper limb: Secondary | ICD-10-CM
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
## 2017-01-28 NOTE — Assessment & Plan Note (Signed)
Clinically, this is likely a ganglion cyst in close proximity to her left radial artery. It is not painful. If this enlarges or becomes more bothersome we can consider an ultrasound or a surgical exploration but I do not think this is likely an aneurysm.

## 2017-01-28 NOTE — Assessment & Plan Note (Signed)
The patient has an asymptomatic carotid bruit. We've had a discussion today about pathophysiology and natural history of carotid disease. We have discussed the carotid bruits can be indicative of carotid disease, but sometimes they do not have a clinical significance. I would recommend a carotid duplex for further evaluation of this bruit. I will see her back following this duplex to discuss the results and determine further treatment options.

## 2017-01-28 NOTE — Patient Instructions (Signed)
Carotid Artery Disease The carotid arteries are the two main arteries on either side of the neck that supply blood to the brain. Carotid artery disease, also called carotid artery stenosis, is the narrowing or blockage of one or both carotid arteries. Carotid artery disease increases your risk for a stroke or a transient ischemic attack (TIA). A TIA is an episode in which a waxy, fatty substance that accumulates within the artery (plaque) blocks blood flow to the brain. A TIA is considered a "warning stroke." What are the causes?  Buildup of plaque inside the carotid arteries (atherosclerosis) (common).  A weakened outpouching in an artery (aneurysm).  Inflammation of the carotid artery (arteritis).  A fibrous growth within the carotid artery (fibromuscular dysplasia).  Tissue death within the carotid artery due to radiation treatment (post-radiation necrosis).  Decreased blood flow due to spasms of the carotid artery (vasospasm).  Separation of the walls of the carotid artery (carotid dissection). What increases the risk?  High cholesterol (dyslipidemia).  High blood pressure (hypertension).  Smoking.  Obesity.  Diabetes.  Family history of cardiovascular disease.  Inactivity or lack of regular exercise.  Being female. Men have an increased risk of developing atherosclerosis earlier in life than women. What are the signs or symptoms? Carotid artery disease does not cause symptoms. How is this diagnosed? Diagnosis of carotid artery disease may include:  A physical exam. Your health care provider may hear an abnormal sound (bruit) when listening to the carotid arteries.  Specific tests that look at the blood flow in the carotid arteries. These tests include: ? Carotid artery ultrasonography. ? Carotid or cerebral angiography. ? Computerized tomographic angiography (CTA). ? Magnetic resonance angiography (MRA).  How is this treated? Treatment of carotid artery disease can  include a combination of treatments. Treatment options include:  Surgery. You may have: ? A carotid endarterectomy. This is a surgery to remove the blockages in the carotid arteries. ? A carotid angioplasty with stenting. This is a nonsurgical interventional procedure. A wire mesh (stent) is used to widen the blocked carotid arteries.  Medicines to control blood pressure, cholesterol, and reduce blood clotting (antiplatelet therapy).  Adjusting your diet.  Lifestyle changes such as: ? Quitting smoking. ? Exercising as tolerated or as directed by your health care provider. ? Controlling and maintaining a good blood pressure. ? Keeping cholesterol levels under control.  Follow these instructions at home:  Take medicines only as directed by your health care provider. Make sure you understand all your medicine instructions. Do not stop your medicines without talking to your health care provider.  Follow your health care provider's diet instructions. It is important to eat a healthy diet that is low in saturated fats and includes plenty of fresh fruits, vegetables, and lean meats. High-fat, high-sodium foods as well as foods that are fried, overly processed, or have poor nutritional value should be avoided.  Maintain a healthy weight.  Stay physically active. It is recommended that you get at least 30 minutes of activity every day.  Do not use any tobacco products including cigarettes, chewing tobacco, or electronic cigarettes. If you need help quitting, ask your health care provider.  Limit alcohol use to: ? No more than 2 drinks per day for men. ? No more than 1 drink per day for nonpregnant women.  Do not use illegal drugs.  Keep all follow-up visits as directed by your health care provider. Get help right away if: You develop TIA or stroke symptoms. These include:  Sudden   weakness or numbness on one side of the body, such as in the face, arm, or leg.  Sudden  confusion.  Trouble speaking (aphasia) or understanding.  Sudden trouble seeing out of one or both eyes.  Sudden trouble walking.  Dizziness or feeling like you might faint.  Loss of balance or coordination.  Sudden severe headache with no known cause.  Sudden trouble swallowing (dysphagia).  If you have any of these symptoms, call your local emergency services (911 in [place] Do not drive yourself to the clinic or hospital. This is a medical emergency. This information is not intended to replace advice given to you by your health care provider. Make sure you discuss any questions you have with your health care provider. Document Released: 11/22/2011 Document Revised: 02/05/2016 Document Reviewed: 02/28/2013 Elsevier Interactive Patient Education  2017 [employer]  

## 2017-01-31 ENCOUNTER — Ambulatory Visit (INDEPENDENT_AMBULATORY_CARE_PROVIDER_SITE_OTHER): Payer: BLUE CROSS/BLUE SHIELD

## 2017-01-31 DIAGNOSIS — R0989 Other specified symptoms and signs involving the circulatory and respiratory systems: Secondary | ICD-10-CM | POA: Diagnosis not present

## 2017-02-01 LAB — VAS US CAROTID
LCCADDIAS: -30 cm/s
LCCADSYS: -88 cm/s
LCCAPDIAS: 32 cm/s
LEFT ECA DIAS: -25 cm/s
LICADDIAS: -21 cm/s
LICAPDIAS: 21 cm/s
Left CCA prox sys: 134 cm/s
Left ICA dist sys: -62 cm/s
Left ICA prox sys: 73 cm/s
RCCADSYS: -72 cm/s
RCCAPSYS: 127 cm/s
RIGHT CCA MID DIAS: 23 cm/s
RIGHT ECA DIAS: -13 cm/s
Right CCA prox dias: 32 cm/s

## 2017-02-06 NOTE — Telephone Encounter (Signed)
Pt notified of carotid ultrasound results via my chart.

## 2017-02-07 ENCOUNTER — Encounter: Payer: Self-pay | Admitting: Internal Medicine

## 2017-02-07 NOTE — Assessment & Plan Note (Signed)
Appears to be more c/w cyst.  No pain.  She would like evaluated.  Will have [facility] evaluate.

## 2017-02-07 NOTE — Assessment & Plan Note (Signed)
Increased stress.  She is doing better.  Follow.

## 2017-02-07 NOTE — Assessment & Plan Note (Signed)
Carotid bruit. Asymptomatic.  Seeing [facility].  Will have them evaluate to see if anything more recommended.  Follow.

## 2017-03-09 ENCOUNTER — Encounter: Payer: BLUE CROSS/BLUE SHIELD | Admitting: Internal Medicine

## 2017-04-20 ENCOUNTER — Ambulatory Visit (INDEPENDENT_AMBULATORY_CARE_PROVIDER_SITE_OTHER): Payer: BLUE CROSS/BLUE SHIELD | Admitting: Internal Medicine

## 2017-04-20 ENCOUNTER — Encounter: Payer: Self-pay | Admitting: Internal Medicine

## 2017-04-20 VITALS — BP 110/68 | HR 61 | Temp 98.6°F | Resp 12 | Ht 65.0 in | Wt 142.2 lb

## 2017-04-20 DIAGNOSIS — R0989 Other specified symptoms and signs involving the circulatory and respiratory systems: Secondary | ICD-10-CM

## 2017-04-20 DIAGNOSIS — Z0000 Encounter for general adult medical examination without abnormal findings: Secondary | ICD-10-CM

## 2017-04-20 DIAGNOSIS — C439 Malignant melanoma of skin, unspecified: Secondary | ICD-10-CM | POA: Diagnosis not present

## 2017-04-20 DIAGNOSIS — C44301 Unspecified malignant neoplasm of skin of nose: Secondary | ICD-10-CM | POA: Diagnosis not present

## 2017-04-20 DIAGNOSIS — F439 Reaction to severe stress, unspecified: Secondary | ICD-10-CM

## 2017-04-20 DIAGNOSIS — D649 Anemia, unspecified: Secondary | ICD-10-CM | POA: Diagnosis not present

## 2017-04-20 DIAGNOSIS — Z13220 Encounter for screening for lipoid disorders: Secondary | ICD-10-CM | POA: Diagnosis not present
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
## 2017-04-22 ENCOUNTER — Encounter: Payer: Self-pay | Admitting: Internal Medicine

## 2017-04-22 NOTE — Assessment & Plan Note (Signed)
Followed by dermatology

## 2017-04-22 NOTE — Assessment & Plan Note (Signed)
Physical today 04/20/17.  PAP 02/11/16.  Colonoscopy 10/11/13.  Due f/u in 09/2016.  Discussed with her today.  She wants to hold on further testing at this time.  Wants to hold on further mammogram at this time.

## 2017-04-22 NOTE — Assessment & Plan Note (Signed)
Continue f/u with dermatology (Dr Verner Chol).

## 2017-04-22 NOTE — Assessment & Plan Note (Signed)
Stress is better.  Feels better.  Follow.

## 2017-04-22 NOTE — Assessment & Plan Note (Signed)
Carotid ultrasound 01/2017 - negative.   

## 2017-04-22 NOTE — Assessment & Plan Note (Signed)
Has had GI w/up previously.  hgb has been stable.  Follow [facility].

## 2017-04-28 ENCOUNTER — Other Ambulatory Visit (INDEPENDENT_AMBULATORY_CARE_PROVIDER_SITE_OTHER): Payer: BLUE CROSS/BLUE SHIELD

## 2017-04-28 DIAGNOSIS — Z13220 Encounter for screening for lipoid disorders: Secondary | ICD-10-CM | POA: Diagnosis not present

## 2017-04-28 DIAGNOSIS — D649 Anemia, unspecified: Secondary | ICD-10-CM | POA: Diagnosis not present

## 2017-04-28 LAB — CBC WITH DIFFERENTIAL/PLATELET
BASOS ABS: 0 10*3/uL (ref 0.0–0.1)
BASOS PCT: 0.5 % (ref 0.0–3.0)
EOS ABS: 0.2 10*3/uL (ref 0.0–0.7)
Eosinophils Relative: 2.6 % (ref 0.0–5.0)
HCT: 36.5 % (ref 36.0–46.0)
HEMOGLOBIN: 12.3 g/dL (ref 12.0–15.0)
LYMPHS PCT: 31.8 % (ref 12.0–46.0)
Lymphs Abs: 2.3 10*3/uL (ref 0.7–4.0)
MCHC: 33.6 g/dL (ref 30.0–36.0)
MCV: 94.2 fl (ref 78.0–100.0)
MONOS PCT: 11.3 % (ref 3.0–12.0)
Monocytes Absolute: 0.8 10*3/uL (ref 0.1–1.0)
NEUTROS ABS: 3.9 10*3/uL (ref 1.4–7.7)
Neutrophils Relative %: 53.8 % (ref 43.0–77.0)
PLATELETS: 431 10*3/uL — AB (ref 150.0–400.0)
RBC: 3.88 Mil/uL (ref 3.87–5.11)
RDW: 13 % (ref 11.5–15.5)
WBC: 7.3 10*3/uL (ref 4.0–10.5)

## 2017-04-28 LAB — FERRITIN: FERRITIN: 13.6 ng/mL (ref 10.0–291.0)

## 2017-04-28 LAB — LIPID PANEL
CHOL/HDL RATIO: 2
Cholesterol: 186 mg/dL (ref 0–200)
HDL: 81.6 mg/dL (ref >39.00)
LDL CALC: 91 mg/dL (ref 0–99)
NONHDL: 104.44
Triglycerides: 69 mg/dL (ref 0.0–149.0)
VLDL: 13.8 mg/dL (ref 0.0–40.0)

## 2017-04-28 LAB — COMPREHENSIVE METABOLIC PANEL
ALBUMIN: 3.9 g/dL (ref 3.5–5.2)
ALT: 12 U/L (ref 0–35)
AST: 21 U/L (ref 0–37)
Alkaline Phosphatase: 35 U/L — ABNORMAL LOW (ref 39–117)
BUN: 11 mg/dL (ref 6–23)
CHLORIDE: 101 meq/L (ref 96–112)
CO2: 28 mEq/L (ref 19–32)
CREATININE: 0.72 mg/dL (ref 0.40–1.20)
Calcium: 9.1 mg/dL (ref 8.4–10.5)
GFR: 86.47 mL/min (ref >60.00)
GLUCOSE: 84 mg/dL (ref 70–99)
POTASSIUM: 4 meq/L (ref 3.5–5.1)
SODIUM: 137 meq/L (ref 135–145)
Total Bilirubin: 0.5 mg/dL (ref 0.2–1.2)
Total Protein: 6.9 g/dL (ref 6.0–8.3)

## 2017-04-28 LAB — TSH: TSH: 1.94 u[IU]/mL (ref 0.35–4.50)

## 2017-04-29 ENCOUNTER — Encounter: Payer: Self-pay | Admitting: Internal Medicine

## 2017-07-20 DIAGNOSIS — L923 Foreign body granuloma of the skin and subcutaneous tissue: Secondary | ICD-10-CM | POA: Diagnosis not present

## 2017-07-20 DIAGNOSIS — L570 Actinic keratosis: Secondary | ICD-10-CM | POA: Diagnosis not present

## 2017-08-16 DIAGNOSIS — L923 Foreign body granuloma of the skin and subcutaneous tissue: Secondary | ICD-10-CM | POA: Diagnosis not present

## 2017-08-26 ENCOUNTER — Ambulatory Visit (INDEPENDENT_AMBULATORY_CARE_PROVIDER_SITE_OTHER): Payer: Medicare Other | Admitting: Internal Medicine

## 2017-08-26 ENCOUNTER — Encounter: Payer: Self-pay | Admitting: Internal Medicine

## 2017-08-26 VITALS — BP 132/78 | HR 75 | Temp 97.6°F | Wt 144.0 lb

## 2017-08-26 DIAGNOSIS — Z1239 Encounter for other screening for malignant neoplasm of breast: Secondary | ICD-10-CM

## 2017-08-26 DIAGNOSIS — C439 Malignant melanoma of skin, unspecified: Secondary | ICD-10-CM

## 2017-08-26 DIAGNOSIS — Z800 Family history of malignant neoplasm of digestive organs: Secondary | ICD-10-CM | POA: Diagnosis not present

## 2017-08-26 DIAGNOSIS — Z1231 Encounter for screening mammogram for malignant neoplasm of breast: Secondary | ICD-10-CM

## 2017-08-26 DIAGNOSIS — R0989 Other specified symptoms and signs involving the circulatory and respiratory systems: Secondary | ICD-10-CM

## 2017-08-26 DIAGNOSIS — F439 Reaction to severe stress, unspecified: Secondary | ICD-10-CM | POA: Diagnosis not present

## 2017-08-26 DIAGNOSIS — J029 Acute pharyngitis, unspecified: Secondary | ICD-10-CM

## 2017-08-26 DIAGNOSIS — D649 Anemia, unspecified: Secondary | ICD-10-CM
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
## 2017-08-28 ENCOUNTER — Encounter: Payer: Self-pay | Admitting: Internal Medicine

## 2017-08-28 NOTE — Assessment & Plan Note (Signed)
Carotid ultrasound 01/2017 - negative.

## 2017-08-28 NOTE — Assessment & Plan Note (Signed)
Has had GI w/up previously.  Due colonoscopy.  Refer to GI for evaluation for colonoscopy.

## 2017-08-28 NOTE — Assessment & Plan Note (Signed)
Increased stress as outlined.  Doing better.  Follow.

## 2017-08-28 NOTE — Assessment & Plan Note (Signed)
Had colonoscopy 10/11/13.  Recommended f/u colonoscopy in 09/2016.  Overdue.  Refer to GI.

## 2017-08-28 NOTE — Assessment & Plan Note (Signed)
Persistent irritation of throat.  She desires evaluation by GI for possible EGD.

## 2017-08-28 NOTE — Assessment & Plan Note (Signed)
Followed by dermatology

## 2017-08-30 ENCOUNTER — Telehealth: Payer: Self-pay

## 2017-08-30 ENCOUNTER — Encounter: Payer: Self-pay | Admitting: Gastroenterology

## 2017-08-30 ENCOUNTER — Other Ambulatory Visit: Payer: Self-pay

## 2017-08-30 DIAGNOSIS — Z800 Family history of malignant neoplasm of digestive organs: Secondary | ICD-10-CM

## 2017-08-30 NOTE — Telephone Encounter (Signed)
Gastroenterology Pre-Procedure Review  Request Date: 10/24/17 Requesting Physician: Dr. Allen Norris  PATIENT REVIEW QUESTIONS: The patient responded to the following health history questions as indicated:    1. Are you having any GI issues? no 2. Do you have a personal history of Polyps? yes (yes) 3. Do you have a family history of Colon Cancer or Polyps? no 4. Diabetes Mellitus? no 5. Joint replacements in the past 12 months?no 6. Major health problems in the past 3 months?no 7. Any artificial heart valves, MVP, or defibrillator?no    MEDICATIONS & ALLERGIES:    Patient reports the following regarding taking any anticoagulation/antiplatelet therapy:   Plavix, Coumadin, Eliquis, Xarelto, Lovenox, Pradaxa, Brilinta, or Effient? no Aspirin? no  Patient confirms/reports the following medications:  Current Outpatient Medications  Medication Sig Dispense Refill  . GLUCOSA-CHONDR-NA CHONDR-MSM PO Take by mouth as needed.    . meloxicam (MOBIC) 7.5 MG tablet Take 1 tablet (7.5 mg total) by mouth daily as needed for pain. (Patient taking differently: Take 7.5 mg by mouth as needed for pain. ) 20 tablet 0  . Multiple Vitamins-Minerals (MULTIVITAMIN PO) Take 1 capsule by mouth daily.      No current facility-administered medications for this visit.     Patient confirms/reports the following allergies:  Allergies  Allergen Reactions  . Penicillins   . Polysporin [Bacitracin-Polymyxin B]     No orders of the defined types were placed in this encounter.   AUTHORIZATION INFORMATION Primary Insurance: 1D#: Group #:  Secondary Insurance: 1D#: Group #:  SCHEDULE INFORMATION: Date: 10/24/17 Time: Location:[facility]

## 2017-09-21 ENCOUNTER — Ambulatory Visit
Admission: RE | Admit: 2017-09-21 | Discharge: 2017-09-21 | Disposition: A | Discharge status: Home / Self Care | Payer: Medicare Other | Source: Ambulatory Visit | Attending: Internal Medicine | Admitting: Internal Medicine

## 2017-09-21 DIAGNOSIS — Z1239 Encounter for other screening for malignant neoplasm of breast: Secondary | ICD-10-CM

## 2017-09-21 DIAGNOSIS — Z1231 Encounter for screening mammogram for malignant neoplasm of breast: Secondary | ICD-10-CM | POA: Diagnosis not present

## 2017-10-18 ENCOUNTER — Other Ambulatory Visit: Payer: Self-pay

## 2017-10-21 NOTE — Discharge Instructions (Signed)
General Anesthesia, Adult, Care After °These instructions provide you with information about caring for yourself after your procedure. Your health care provider may also give you more specific instructions. Your treatment has been planned according to current medical practices, but problems sometimes occur. Call your health care provider if you have any problems or questions after your procedure. °What can I expect after the procedure? °After the procedure, it is common to have: °· Vomiting. °· A sore throat. °· Mental slowness. ° °It is common to feel: °· Nauseous. °· Cold or shivery. °· Sleepy. °· Tired. °· Sore or achy, even in parts of your body where you did not have surgery. ° °Follow these instructions at home: °For at least 24 hours after the procedure: °· Do not: °? Participate in activities where you could fall or become injured. °? Drive. °? Use heavy machinery. °? Drink alcohol. °? Take sleeping pills or medicines that cause drowsiness. °? Make important decisions or sign legal documents. °? Take care of children on your own. °· Rest. °Eating and drinking °· If you vomit, drink water, juice, or soup when you can drink without vomiting. °· Drink enough fluid to keep your urine clear or pale yellow. °· Make sure you have little or no nausea before eating solid foods. °· Follow the diet recommended by your health care provider. °General instructions °· Have a responsible adult stay with you until you are awake and alert. °· Return to your normal activities as told by your health care provider. Ask your health care provider what activities are safe for you. °· Take over-the-counter and prescription medicines only as told by your health care provider. °· If you smoke, do not smoke without supervision. °· Keep all follow-up visits as told by your health care provider. This is important. °Contact a health care provider if: °· You continue to have nausea or vomiting at home, and medicines are not helpful. °· You  cannot drink fluids or start eating again. °· You cannot urinate after 8-12 hours. °· You develop a skin rash. °· You have fever. °· You have increasing redness at the site of your procedure. °Get help right away if: °· You have difficulty breathing. °· You have chest pain. °· You have unexpected bleeding. °· You feel that you are having a life-threatening or urgent problem. °This information is not intended to replace advice given to you by your health care provider. Make sure you discuss any questions you have with your health care provider. °Document Released: 12/06/2000 Document Revised: 02/02/2016 Document Reviewed: 08/14/2015 °Elsevier Interactive Patient Education © 2018 [employer] ° °

## 2017-10-24 ENCOUNTER — Ambulatory Visit: Payer: Medicare Other | Admitting: Anesthesiology

## 2017-10-24 ENCOUNTER — Encounter
Admission: RE | Disposition: A | Discharge status: Home / Self Care | Payer: Self-pay | Source: Ambulatory Visit | Attending: Gastroenterology

## 2017-10-24 ENCOUNTER — Ambulatory Visit
Admission: RE | Admit: 2017-10-24 | Discharge: 2017-10-24 | Disposition: A | Discharge status: Home / Self Care | Payer: Medicare Other | Source: Ambulatory Visit | Attending: Gastroenterology | Admitting: Gastroenterology

## 2017-10-24 DIAGNOSIS — K449 Diaphragmatic hernia without obstruction or gangrene: Secondary | ICD-10-CM | POA: Diagnosis not present

## 2017-10-24 DIAGNOSIS — K2900 Acute gastritis without bleeding: Secondary | ICD-10-CM

## 2017-10-24 DIAGNOSIS — K2950 Unspecified chronic gastritis without bleeding: Secondary | ICD-10-CM | POA: Diagnosis not present

## 2017-10-24 DIAGNOSIS — R12 Heartburn: Secondary | ICD-10-CM | POA: Diagnosis not present

## 2017-10-24 DIAGNOSIS — K640 First degree hemorrhoids: Secondary | ICD-10-CM | POA: Insufficient documentation

## 2017-10-24 DIAGNOSIS — D123 Benign neoplasm of transverse colon: Secondary | ICD-10-CM | POA: Diagnosis not present

## 2017-10-24 DIAGNOSIS — Z800 Family history of malignant neoplasm of digestive organs: Secondary | ICD-10-CM | POA: Diagnosis not present

## 2017-10-24 DIAGNOSIS — K2930 Chronic superficial gastritis without bleeding: Secondary | ICD-10-CM | POA: Diagnosis not present

## 2017-10-24 DIAGNOSIS — K219 Gastro-esophageal reflux disease without esophagitis: Secondary | ICD-10-CM

## 2017-10-24 DIAGNOSIS — Z79899 Other long term (current) drug therapy: Secondary | ICD-10-CM | POA: Diagnosis not present

## 2017-10-24 DIAGNOSIS — Z85820 Personal history of malignant melanoma of skin: Secondary | ICD-10-CM | POA: Diagnosis not present

## 2017-10-24 DIAGNOSIS — Z9882 Breast implant status: Secondary | ICD-10-CM | POA: Diagnosis not present

## 2017-10-24 DIAGNOSIS — K2970 Gastritis, unspecified, without bleeding: Secondary | ICD-10-CM | POA: Diagnosis not present

## 2017-10-24 HISTORY — DX: Unspecified osteoarthritis, unspecified site: M19.90

## 2017-10-24 HISTORY — PX: ESOPHAGOGASTRODUODENOSCOPY (EGD) WITH PROPOFOL: SHX5813

## 2017-10-24 HISTORY — DX: Unspecified hearing loss, unspecified ear: H91.90

## 2017-10-24 HISTORY — PX: COLONOSCOPY WITH PROPOFOL: SHX5780

## 2017-10-24 SURGERY — ESOPHAGOGASTRODUODENOSCOPY (EGD) WITH PROPOFOL
Anesthesia: General | Wound class: Clean Contaminated

## 2017-10-24 MED ORDER — SODIUM CHLORIDE 0.9 % IV SOLN: INTRAVENOUS | Status: DC

## 2017-10-24 MED ORDER — GLYCOPYRROLATE 0.2 MG/ML IJ SOLN
INTRAMUSCULAR | Status: DC | PRN
  Administered 2017-10-24: 0.1 mg via INTRAVENOUS

## 2017-10-24 MED ORDER — LIDOCAINE HCL (CARDIAC) 20 MG/ML IV SOLN
INTRAVENOUS | Status: DC | PRN
  Administered 2017-10-24: 40 mg via INTRAVENOUS

## 2017-10-24 MED ORDER — LACTATED RINGERS IV SOLN
INTRAVENOUS | Status: DC
  Administered 2017-10-24: 07:00:00 via INTRAVENOUS

## 2017-10-24 MED ORDER — STERILE WATER FOR IRRIGATION IR SOLN
Status: DC | PRN
  Administered 2017-10-24: 08:00:00

## 2017-10-24 MED ORDER — LACTATED RINGERS IV SOLN: 1000.0000 mL | INTRAVENOUS | Status: DC

## 2017-10-24 MED ORDER — PROPOFOL 10 MG/ML IV BOLUS
INTRAVENOUS | Status: DC | PRN
  Administered 2017-10-24: 100 mg via INTRAVENOUS
  Administered 2017-10-24 (×10): 20 mg via INTRAVENOUS

## 2017-10-24 SURGICAL SUPPLY — 36 items
BALLN DILATOR 10-12 8 (BALLOONS)
BALLN DILATOR 12-15 8 (BALLOONS)
BALLN DILATOR 15-18 8 (BALLOONS)
BALLN DILATOR CRE 0-12 8 (BALLOONS)
BALLN DILATOR ESOPH 8 10 CRE (MISCELLANEOUS) IMPLANT
BALLOON DILATOR 12-15 8 (BALLOONS) IMPLANT
BALLOON DILATOR 15-18 8 (BALLOONS) IMPLANT
BALLOON DILATOR CRE 0-12 8 (BALLOONS) IMPLANT
BLOCK BITE 60FR ADLT L/F GRN (MISCELLANEOUS) ×3 IMPLANT
CANISTER SUCT 1200ML W/VALVE (MISCELLANEOUS) ×3 IMPLANT
CLIP HMST 235XBRD CATH ROT (MISCELLANEOUS) IMPLANT
CLIP RESOLUTION 360 11X235 (MISCELLANEOUS)
ELECT REM PT RETURN 9FT ADLT (ELECTROSURGICAL)
ELECTRODE REM PT RTRN 9FT ADLT (ELECTROSURGICAL) IMPLANT
FCP ESCP3.2XJMB 240X2.8X (MISCELLANEOUS)
FORCEPS BIOP RAD 4 LRG CAP 4 (CUTTING FORCEPS) ×3 IMPLANT
FORCEPS BIOP RJ4 240 W/NDL (MISCELLANEOUS)
FORCEPS ESCP3.2XJMB 240X2.8X (MISCELLANEOUS) IMPLANT
GOWN CVR UNV OPN BCK APRN NK (MISCELLANEOUS) ×2 IMPLANT
GOWN ISOL THUMB LOOP REG UNIV (MISCELLANEOUS) ×4
INJECTOR VARIJECT VIN23 (MISCELLANEOUS) IMPLANT
KIT DEFENDO VALVE AND CONN (KITS) IMPLANT
KIT ENDO PROCEDURE OLY (KITS) ×3 IMPLANT
MARKER SPOT ENDO TATTOO 5ML (MISCELLANEOUS) IMPLANT
PROBE APC STR FIRE (PROBE) IMPLANT
RETRIEVER NET PLAT FOOD (MISCELLANEOUS) IMPLANT
RETRIEVER NET ROTH 2.5X230 LF (MISCELLANEOUS) IMPLANT
SNARE SHORT THROW 13M SML OVAL (MISCELLANEOUS) ×3 IMPLANT
SNARE SHORT THROW 30M LRG OVAL (MISCELLANEOUS) IMPLANT
SNARE SNG USE RND 15MM (INSTRUMENTS) IMPLANT
SPOT EX ENDOSCOPIC TATTOO (MISCELLANEOUS)
SYR INFLATION 60ML (SYRINGE) IMPLANT
TRAP ETRAP POLY (MISCELLANEOUS) ×3 IMPLANT
VARIJECT INJECTOR VIN23 (MISCELLANEOUS)
WATER STERILE IRR 250ML POUR (IV SOLUTION) ×3 IMPLANT
WIRE CRE 18-20MM 8CM F G (MISCELLANEOUS) IMPLANT

## 2017-10-24 NOTE — Anesthesia Procedure Notes (Signed)
Date/Time: 10/24/2017 7:54 AM Performed by: Cameron Ali, CRNA Pre-anesthesia Checklist: Patient identified, Emergency Drugs available, Suction available, Timeout performed and Patient being monitored Patient Re-evaluated:Patient Re-evaluated prior to induction Oxygen Delivery Method: Nasal cannula Placement Confirmation: positive ETCO2

## 2017-10-24 NOTE — Anesthesia Postprocedure Evaluation (Signed)
Anesthesia Post Note  Patient: Julie Porter  Procedure(s) Performed: ESOPHAGOGASTRODUODENOSCOPY (EGD) WITH PROPOFOL (N/A ) COLONOSCOPY WITH PROPOFOL (N/A )  Patient location during evaluation: PACU Anesthesia Type: General Level of consciousness: awake Pain management: pain level controlled Vital Signs Assessment: post-procedure vital signs reviewed and stable Respiratory status: spontaneous breathing Cardiovascular status: blood pressure returned to baseline Postop Assessment: no headache Anesthetic complications: no    Lavonna Monarch

## 2017-10-24 NOTE — Op Note (Signed)
[facility] Gastroenterology Patient Name: Julie Porter Procedure Date: 10/24/2017 7:50 AM MRN: [id number] Account #: [id number] Date of Birth: 02-Jul-1951 Admit Type: Outpatient Age: 67 Room: [facility] OR ROOM 01 Gender: Female Note Status: Finalized Procedure:            Upper GI endoscopy Indications:          Heartburn Providers:            Lucilla Lame MD, MD Referring MD:         Einar Pheasant, MD (Referring MD) Medicines:            Propofol per Anesthesia Complications:        No immediate complications. Procedure:            Pre-Anesthesia Assessment:                       - Prior to the procedure, a History and Physical was                        performed, and patient medications and allergies were                        reviewed. The patient's tolerance of previous                        anesthesia was also reviewed. The risks and benefits of                        the procedure and the sedation options and risks were                        discussed with the patient. All questions were                        answered, and informed consent was obtained. Prior                        Anticoagulants: The patient has taken no previous                        anticoagulant or antiplatelet agents. ASA Grade                        Assessment: II - A patient with mild systemic disease.                        After reviewing the risks and benefits, the patient was                        deemed in satisfactory condition to undergo the                        procedure.                       After obtaining informed consent, the endoscope was                        passed under direct vision. Throughout the procedure,  the patient's blood pressure, pulse, and oxygen                        saturations were monitored continuously. The Olympus                        [id number]-HQ190 Endoscope (S#. [id number]) was introduced                        through the  mouth, and advanced to the second part of                        duodenum. The upper GI endoscopy was accomplished                        without difficulty. The patient tolerated the procedure                        well. Findings:      A medium-sized hiatal hernia was present.      The Z-line was irregular.      Localized mild inflammation characterized by erythema was found in the       gastric antrum. Biopsies were taken with a cold forceps for histology.      The examined duodenum was normal. Impression:           - Medium-sized hiatal hernia.                       - Z-line irregular.                       - Gastritis. Biopsied.                       - Normal examined duodenum. Recommendation:       - Discharge patient to home.                       - Resume previous diet.                       - Continue present medications.                       - Await pathology results.                       - Perform a colonoscopy today. Procedure Code(s):    --- Professional ---                       [phone], Esophagogastroduodenoscopy, flexible, transoral;                        with biopsy, single or multiple Diagnosis Code(s):    --- Professional ---                       R12, Heartburn                       K29.70, Gastritis, unspecified, without bleeding CPT copyright 2016 American Medical Association. All rights reserved. The codes documented in this report are preliminary and upon coder review may  be revised  to meet current compliance requirements. Lucilla Lame MD, MD 10/24/2017 8:04:15 AM This report has been signed electronically. Number of Addenda: 0 Note Initiated On: 10/24/2017 7:50 AM      [facility]

## 2017-10-24 NOTE — Transfer of Care (Signed)
Immediate Anesthesia Transfer of Care Note  Patient: Julie Porter  Procedure(s) Performed: ESOPHAGOGASTRODUODENOSCOPY (EGD) WITH PROPOFOL (N/A ) COLONOSCOPY WITH PROPOFOL (N/A )  Patient Location: PACU  Anesthesia Type: General  Level of Consciousness: awake, alert  and patient cooperative  Airway and Oxygen Therapy: Patient Spontanous Breathing and Patient connected to supplemental oxygen  Post-op Assessment: Post-op Vital signs reviewed, Patient's Cardiovascular Status Stable, Respiratory Function Stable, Patent Airway and No signs of Nausea or vomiting  Post-op Vital Signs: Reviewed and stable  Complications: No apparent anesthesia complications
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
## 2017-10-24 NOTE — Op Note (Signed)
[facility] Patient Name: Julie Porter Procedure Date: 10/24/2017 7:51 AM MRN: [id number] Account #: [id number] Date of Birth: 1950-12-20 Admit Type: Outpatient Age: 67 Room: [facility] OR ROOM 01 Gender: Female Note Status: Finalized Procedure:            Colonoscopy Indications:          Family history of colon cancer Providers:            Lucilla Lame MD, MD Referring MD:         Einar Pheasant, MD (Referring MD) Medicines:            Propofol per Anesthesia Complications:        No immediate complications. Procedure:            Pre-Anesthesia Assessment:                       - Prior to the procedure, a History and Physical was                        performed, and patient medications and allergies were                        reviewed. The patient's tolerance of previous                        anesthesia was also reviewed. The risks and benefits of                        the procedure and the sedation options and risks were                        discussed with the patient. All questions were                        answered, and informed consent was obtained. Prior                        Anticoagulants: The patient has taken no previous                        anticoagulant or antiplatelet agents. ASA Grade                        Assessment: II - A patient with mild systemic disease.                        After reviewing the risks and benefits, the patient was                        deemed in satisfactory condition to undergo the                        procedure.                       After obtaining informed consent, the colonoscope was                        passed under direct vision. Throughout the procedure,  the patient's blood pressure, pulse, and oxygen                        saturations were monitored continuously. The Olympus                        Colonoscope 190 [id number]) was introduced through the           anus and advanced to the the cecum, identified by                        appendiceal orifice and ileocecal valve. The                        colonoscopy was performed without difficulty. The                        patient tolerated the procedure well. The quality of                        the bowel preparation was excellent. Findings:      The perianal and digital rectal examinations were normal.      A 4 mm polyp was found in the transverse colon. The polyp was sessile.       The polyp was removed with a cold snare. Resection and retrieval were       complete.      Non-bleeding internal hemorrhoids were found during retroflexion. The       hemorrhoids were Grade I (internal hemorrhoids that do not prolapse). Impression:           - One 4 mm polyp in the transverse colon, removed with                        a cold snare. Resected and retrieved.                       - Non-bleeding internal hemorrhoids. Recommendation:       - Discharge patient to home.                       - Resume previous diet.                       - Continue present medications.                       - Await pathology results.                       - Repeat colonoscopy in 5 years for surveillance. Procedure Code(s):    --- Professional ---                       [phone], Colonoscopy, flexible; with removal of tumor(s),                        polyp(s), or other lesion(s) by snare technique Diagnosis Code(s):    --- Professional ---                       Z80.0, Family history of malignant neoplasm of  digestive organs                       D12.3, Benign neoplasm of transverse colon (hepatic                        flexure or splenic flexure) CPT copyright 2016 American Medical Association. All rights reserved. The codes documented in this report are preliminary and upon coder review may  be revised to meet current compliance requirements. Lucilla Lame MD, MD 10/24/2017 8:21:41 AM This  report has been signed electronically. Number of Addenda: 0 Note Initiated On: 10/24/2017 7:51 AM Scope Withdrawal Time: 0 hours 7 minutes 27 seconds  Total Procedure Duration: 0 hours 10 minutes 28 seconds       [facility]

## 2017-10-24 NOTE — Anesthesia Preprocedure Evaluation (Addendum)
Anesthesia Evaluation  Patient identified by MRN, date of birth, ID band Patient awake    Reviewed: Allergy & Precautions, NPO status , Patient's Chart, lab work & pertinent test results, reviewed documented beta blocker date and time   Airway Mallampati: I  TM Distance: >3 FB Neck ROM: Full    Dental no notable dental hx.    Pulmonary neg pulmonary ROS,    Pulmonary exam normal breath sounds clear to auscultation       Cardiovascular negative cardio ROS Normal cardiovascular exam Rhythm:Regular Rate:Normal     Neuro/Psych negative neurological ROS  negative psych ROS   GI/Hepatic negative GI ROS, Neg liver ROS,   Endo/Other  negative endocrine ROS  Renal/GU negative Renal ROS  negative genitourinary   Musculoskeletal  (+) Arthritis ,   Abdominal Normal abdominal exam  (+)  Abdomen: soft.    Peds  Hematology  (+) anemia ,   Anesthesia Other Findings   Reproductive/Obstetrics                             Lab Results  Component Value Date   WBC 7.3 04/28/2017   HGB 12.3 04/28/2017   HCT 36.5 04/28/2017   MCV 94.2 04/28/2017   PLT 431.0 (H) 04/28/2017     Anesthesia Physical Anesthesia Plan  ASA: II  Anesthesia Plan: General   Post-op Pain Management:    Induction:   PONV Risk Score and Plan:   Airway Management Planned: Natural Airway  Additional Equipment:   Intra-op Plan:   Post-operative Plan:   Informed Consent: I have reviewed the patients History and Physical, chart, labs and discussed the procedure including the risks, benefits and alternatives for the proposed anesthesia with the patient or authorized representative who has indicated his/her understanding and acceptance.     Plan Discussed with: CRNA, Anesthesiologist and Surgeon  Anesthesia Plan Comments:         Anesthesia Quick Evaluation

## 2017-10-25 ENCOUNTER — Encounter: Payer: Self-pay | Admitting: Gastroenterology

## 2017-10-26 ENCOUNTER — Encounter: Payer: Self-pay | Admitting: Gastroenterology

## 2017-10-27 ENCOUNTER — Encounter: Payer: Self-pay | Admitting: Gastroenterology

## 2017-11-15 DIAGNOSIS — Z85828 Personal history of other malignant neoplasm of skin: Secondary | ICD-10-CM | POA: Diagnosis not present

## 2017-11-15 DIAGNOSIS — L570 Actinic keratosis: Secondary | ICD-10-CM | POA: Diagnosis not present

## 2017-11-15 DIAGNOSIS — Z85820 Personal history of malignant melanoma of skin: Secondary | ICD-10-CM | POA: Diagnosis not present

## 2017-11-15 DIAGNOSIS — L738 Other specified follicular disorders: Secondary | ICD-10-CM | POA: Diagnosis not present

## 2017-11-15 DIAGNOSIS — Z872 Personal history of diseases of the skin and subcutaneous tissue: Secondary | ICD-10-CM | POA: Diagnosis not present

## 2017-11-15 DIAGNOSIS — Z86018 Personal history of other benign neoplasm: Secondary | ICD-10-CM | POA: Diagnosis not present

## 2017-11-15 DIAGNOSIS — D2330 Other benign neoplasm of skin of unspecified part of face: Secondary | ICD-10-CM | POA: Diagnosis not present

## 2017-11-15 DIAGNOSIS — L578 Other skin changes due to chronic exposure to nonionizing radiation: Secondary | ICD-10-CM | POA: Diagnosis not present

## 2017-12-27 ENCOUNTER — Encounter: Payer: Self-pay | Admitting: Internal Medicine

## 2017-12-27 ENCOUNTER — Ambulatory Visit (INDEPENDENT_AMBULATORY_CARE_PROVIDER_SITE_OTHER): Payer: Medicare Other | Admitting: Internal Medicine

## 2017-12-27 DIAGNOSIS — C439 Malignant melanoma of skin, unspecified: Secondary | ICD-10-CM | POA: Diagnosis not present

## 2017-12-27 DIAGNOSIS — D649 Anemia, unspecified: Secondary | ICD-10-CM

## 2017-12-27 DIAGNOSIS — M19042 Primary osteoarthritis, left hand: Secondary | ICD-10-CM | POA: Diagnosis not present

## 2017-12-27 DIAGNOSIS — F439 Reaction to severe stress, unspecified: Secondary | ICD-10-CM | POA: Diagnosis not present
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
## 2017-12-30 ENCOUNTER — Encounter: Payer: Self-pay | Admitting: Internal Medicine

## 2017-12-30 DIAGNOSIS — M19049 Primary osteoarthritis, unspecified hand: Secondary | ICD-10-CM | POA: Insufficient documentation

## 2017-12-30 NOTE — Assessment & Plan Note (Signed)
Increased stress as outlined.  Discussed with her today.  She does not feel needs anything more at this time.  Follow.   

## 2017-12-30 NOTE — Assessment & Plan Note (Signed)
Followed by dermatology

## 2017-12-30 NOTE — Assessment & Plan Note (Signed)
Some stiffness.  Desires no further intervention.  Follow.

## 2017-12-30 NOTE — Assessment & Plan Note (Signed)
Just had f/u colonoscopy.  Follow cbc.

## 2018-04-05 DIAGNOSIS — L700 Acne vulgaris: Secondary | ICD-10-CM | POA: Diagnosis not present

## 2018-04-05 DIAGNOSIS — L821 Other seborrheic keratosis: Secondary | ICD-10-CM | POA: Diagnosis not present

## 2018-04-28 ENCOUNTER — Ambulatory Visit (INDEPENDENT_AMBULATORY_CARE_PROVIDER_SITE_OTHER): Payer: Medicare Other | Admitting: Internal Medicine

## 2018-04-28 ENCOUNTER — Encounter: Payer: Self-pay | Admitting: Internal Medicine

## 2018-04-28 DIAGNOSIS — F439 Reaction to severe stress, unspecified: Secondary | ICD-10-CM

## 2018-04-28 DIAGNOSIS — Z0000 Encounter for general adult medical examination without abnormal findings: Secondary | ICD-10-CM | POA: Diagnosis not present

## 2018-04-28 DIAGNOSIS — C439 Malignant melanoma of skin, unspecified: Secondary | ICD-10-CM | POA: Diagnosis not present

## 2018-04-28 DIAGNOSIS — D649 Anemia, unspecified: Secondary | ICD-10-CM | POA: Diagnosis not present

## 2018-04-28 DIAGNOSIS — Z800 Family history of malignant neoplasm of digestive organs: Secondary | ICD-10-CM | POA: Diagnosis not present
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
## 2018-04-28 NOTE — Assessment & Plan Note (Addendum)
Physical today 04/28/18.  PAP 02/11/16 - negative with negative HPV.  Colonoscopy 10/24/17 - precancerous polyp.  Recommended f/u in 5 years.

## 2018-05-02 ENCOUNTER — Telehealth: Payer: Self-pay | Admitting: Radiology

## 2018-05-02 NOTE — Telephone Encounter (Signed)
Pt coming in tomorrow for labs, please place future orders. Thank you.  

## 2018-05-03 ENCOUNTER — Other Ambulatory Visit: Payer: Self-pay | Admitting: Internal Medicine

## 2018-05-03 ENCOUNTER — Other Ambulatory Visit (INDEPENDENT_AMBULATORY_CARE_PROVIDER_SITE_OTHER): Payer: Medicare Other

## 2018-05-03 DIAGNOSIS — Z13220 Encounter for screening for lipoid disorders: Secondary | ICD-10-CM | POA: Diagnosis not present

## 2018-05-03 DIAGNOSIS — D649 Anemia, unspecified: Secondary | ICD-10-CM

## 2018-05-03 DIAGNOSIS — D75839 Thrombocytosis, unspecified: Secondary | ICD-10-CM

## 2018-05-03 DIAGNOSIS — D473 Essential (hemorrhagic) thrombocythemia: Secondary | ICD-10-CM

## 2018-05-03 LAB — IBC PANEL
Iron: 126 ug/dL (ref 42–145)
SATURATION RATIOS: 29.1 % (ref 20.0–50.0)
Transferrin: 309 mg/dL (ref 212.0–360.0)

## 2018-05-03 LAB — HEPATIC FUNCTION PANEL
ALT: 12 U/L (ref 0–35)
AST: 18 U/L (ref 0–37)
Albumin: 4.1 g/dL (ref 3.5–5.2)
Alkaline Phosphatase: 36 U/L — ABNORMAL LOW (ref 39–117)
BILIRUBIN DIRECT: 0.1 mg/dL (ref 0.0–0.3)
BILIRUBIN TOTAL: 0.7 mg/dL (ref 0.2–1.2)
Total Protein: 7.3 g/dL (ref 6.0–8.3)

## 2018-05-03 LAB — CBC WITH DIFFERENTIAL/PLATELET
Basophils Absolute: 0 10*3/uL (ref 0.0–0.1)
Basophils Relative: 0.7 % (ref 0.0–3.0)
EOS ABS: 0.1 10*3/uL (ref 0.0–0.7)
Eosinophils Relative: 1.5 % (ref 0.0–5.0)
HCT: 36.4 % (ref 36.0–46.0)
Hemoglobin: 12.5 g/dL (ref 12.0–15.0)
LYMPHS ABS: 1.8 10*3/uL (ref 0.7–4.0)
Lymphocytes Relative: 31.4 % (ref 12.0–46.0)
MCHC: 34.3 g/dL (ref 30.0–36.0)
MCV: 93.1 fl (ref 78.0–100.0)
MONO ABS: 0.7 10*3/uL (ref 0.1–1.0)
Monocytes Relative: 12.7 % — ABNORMAL HIGH (ref 3.0–12.0)
NEUTROS ABS: 3 10*3/uL (ref 1.4–7.7)
NEUTROS PCT: 53.7 % (ref 43.0–77.0)
PLATELETS: 412 10*3/uL — AB (ref 150.0–400.0)
RBC: 3.9 Mil/uL (ref 3.87–5.11)
RDW: 13 % (ref 11.5–15.5)
WBC: 5.7 10*3/uL (ref 4.0–10.5)

## 2018-05-03 LAB — LIPID PANEL
CHOLESTEROL: 193 mg/dL (ref 0–200)
HDL: 87.6 mg/dL (ref >39.00)
LDL Cholesterol: 94 mg/dL (ref 0–99)
NonHDL: 105.57
TRIGLYCERIDES: 59 mg/dL (ref 0.0–149.0)
Total CHOL/HDL Ratio: 2
VLDL: 11.8 mg/dL (ref 0.0–40.0)

## 2018-05-03 LAB — BASIC METABOLIC PANEL
BUN: 18 mg/dL (ref 6–23)
CALCIUM: 9.4 mg/dL (ref 8.4–10.5)
CO2: 26 meq/L (ref 19–32)
Chloride: 102 mEq/L (ref 96–112)
Creatinine, Ser: 0.74 mg/dL (ref 0.40–1.20)
GFR: 83.51 mL/min (ref >60.00)
Glucose, Bld: 87 mg/dL (ref 70–99)
POTASSIUM: 4.3 meq/L (ref 3.5–5.1)
SODIUM: 138 meq/L (ref 135–145)

## 2018-05-03 LAB — FERRITIN: FERRITIN: 20.7 ng/mL (ref 10.0–291.0)

## 2018-05-03 LAB — TSH: TSH: 1.92 u[IU]/mL (ref 0.35–4.50)

## 2018-05-03 NOTE — Progress Notes (Signed)
Orders placed for labs

## 2018-05-03 NOTE — Telephone Encounter (Signed)
Orders placed for labs

## 2018-05-05 ENCOUNTER — Other Ambulatory Visit: Payer: Self-pay | Admitting: Internal Medicine

## 2018-05-05 DIAGNOSIS — D473 Essential (hemorrhagic) thrombocythemia: Secondary | ICD-10-CM

## 2018-05-05 DIAGNOSIS — D75839 Thrombocytosis, unspecified: Secondary | ICD-10-CM

## 2018-05-05 NOTE — Progress Notes (Signed)
Order placed for f/u platelet count.  

## 2018-05-07 ENCOUNTER — Encounter: Payer: Self-pay | Admitting: Internal Medicine

## 2018-05-07 NOTE — Assessment & Plan Note (Signed)
Follow cbc.  

## 2018-05-07 NOTE — Assessment & Plan Note (Signed)
Colonoscopy 10/24/17.  Continues f/u with GI.

## 2018-05-07 NOTE — Assessment & Plan Note (Signed)
Increased stress as outlined.  Discussed with her today.  She does not feel needs anything more at this time.  Follow.   

## 2018-05-07 NOTE — Assessment & Plan Note (Signed)
Followed by dermatology

## 2018-06-22 DIAGNOSIS — L720 Epidermal cyst: Secondary | ICD-10-CM | POA: Diagnosis not present

## 2018-06-22 DIAGNOSIS — L728 Other follicular cysts of the skin and subcutaneous tissue: Secondary | ICD-10-CM | POA: Diagnosis not present

## 2018-06-22 DIAGNOSIS — L570 Actinic keratosis: Secondary | ICD-10-CM | POA: Diagnosis not present

## 2018-06-22 DIAGNOSIS — X32XXXA Exposure to sunlight, initial encounter: Secondary | ICD-10-CM | POA: Diagnosis not present

## 2018-06-22 DIAGNOSIS — L821 Other seborrheic keratosis: Secondary | ICD-10-CM | POA: Diagnosis not present

## 2018-06-22 DIAGNOSIS — Z08 Encounter for follow-up examination after completed treatment for malignant neoplasm: Secondary | ICD-10-CM | POA: Diagnosis not present

## 2018-06-22 DIAGNOSIS — Z85820 Personal history of malignant melanoma of skin: Secondary | ICD-10-CM | POA: Diagnosis not present

## 2018-07-10 ENCOUNTER — Other Ambulatory Visit (INDEPENDENT_AMBULATORY_CARE_PROVIDER_SITE_OTHER): Payer: Medicare Other

## 2018-07-10 DIAGNOSIS — D473 Essential (hemorrhagic) thrombocythemia: Secondary | ICD-10-CM | POA: Diagnosis not present

## 2018-07-10 DIAGNOSIS — D75839 Thrombocytosis, unspecified: Secondary | ICD-10-CM

## 2018-07-10 LAB — PLATELET COUNT: PLATELETS: 437 10*3/uL — AB (ref 140–400)

## 2018-07-11 ENCOUNTER — Encounter: Payer: Self-pay | Admitting: Internal Medicine

## 2018-07-28 DIAGNOSIS — Z23 Encounter for immunization: Secondary | ICD-10-CM | POA: Diagnosis not present

## 2018-11-02 ENCOUNTER — Ambulatory Visit (INDEPENDENT_AMBULATORY_CARE_PROVIDER_SITE_OTHER): Payer: PPO | Admitting: Internal Medicine

## 2018-11-02 ENCOUNTER — Encounter: Payer: Self-pay | Admitting: Internal Medicine

## 2018-11-02 VITALS — BP 124/72 | HR 60 | Temp 97.7°F | Resp 16 | Wt 145.6 lb

## 2018-11-02 DIAGNOSIS — Z23 Encounter for immunization: Secondary | ICD-10-CM | POA: Diagnosis not present

## 2018-11-02 DIAGNOSIS — D75839 Thrombocytosis, unspecified: Secondary | ICD-10-CM

## 2018-11-02 DIAGNOSIS — D473 Essential (hemorrhagic) thrombocythemia: Secondary | ICD-10-CM

## 2018-11-02 DIAGNOSIS — Z1231 Encounter for screening mammogram for malignant neoplasm of breast: Secondary | ICD-10-CM | POA: Diagnosis not present

## 2018-11-02 DIAGNOSIS — C439 Malignant melanoma of skin, unspecified: Secondary | ICD-10-CM | POA: Diagnosis not present

## 2018-11-02 DIAGNOSIS — E2839 Other primary ovarian failure: Secondary | ICD-10-CM

## 2018-11-02 DIAGNOSIS — F439 Reaction to severe stress, unspecified: Secondary | ICD-10-CM

## 2018-11-02 DIAGNOSIS — D649 Anemia, unspecified: Secondary | ICD-10-CM

## 2018-11-02 LAB — IBC PANEL
Iron: 95 ug/dL (ref 42–145)
Saturation Ratios: 22.5 % (ref 20.0–50.0)
Transferrin: 302 mg/dL (ref 212.0–360.0)

## 2018-11-02 LAB — CBC WITH DIFFERENTIAL/PLATELET
Basophils Absolute: 0 10*3/uL (ref 0.0–0.1)
Basophils Relative: 0.4 % (ref 0.0–3.0)
Eosinophils Absolute: 0.2 10*3/uL (ref 0.0–0.7)
Eosinophils Relative: 2.3 % (ref 0.0–5.0)
HCT: 37.3 % (ref 36.0–46.0)
Hemoglobin: 12.5 g/dL (ref 12.0–15.0)
Lymphocytes Relative: 35.4 % (ref 12.0–46.0)
Lymphs Abs: 2.6 10*3/uL (ref 0.7–4.0)
MCHC: 33.6 g/dL (ref 30.0–36.0)
MCV: 94.2 fl (ref 78.0–100.0)
Monocytes Absolute: 0.8 10*3/uL (ref 0.1–1.0)
Monocytes Relative: 10.5 % (ref 3.0–12.0)
Neutro Abs: 3.8 10*3/uL (ref 1.4–7.7)
Neutrophils Relative %: 51.4 % (ref 43.0–77.0)
Platelets: 416 10*3/uL — ABNORMAL HIGH (ref 150.0–400.0)
RBC: 3.95 Mil/uL (ref 3.87–5.11)
RDW: 13 % (ref 11.5–15.5)
WBC: 7.3 10*3/uL (ref 4.0–10.5)

## 2018-11-02 LAB — FERRITIN: FERRITIN: 27.6 ng/mL (ref 10.0–291.0)
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
## 2018-11-03 ENCOUNTER — Other Ambulatory Visit: Payer: Self-pay | Admitting: Internal Medicine

## 2018-11-03 DIAGNOSIS — D473 Essential (hemorrhagic) thrombocythemia: Secondary | ICD-10-CM

## 2018-11-03 DIAGNOSIS — D75839 Thrombocytosis, unspecified: Secondary | ICD-10-CM

## 2018-11-03 NOTE — Progress Notes (Signed)
Order placed for f/u cbc and ferritin.   

## 2018-11-04 ENCOUNTER — Encounter: Payer: Self-pay | Admitting: Internal Medicine

## 2018-11-04 DIAGNOSIS — D75839 Thrombocytosis, unspecified: Secondary | ICD-10-CM | POA: Insufficient documentation

## 2018-11-04 DIAGNOSIS — D473 Essential (hemorrhagic) thrombocythemia: Secondary | ICD-10-CM | POA: Insufficient documentation

## 2018-11-04 NOTE — Assessment & Plan Note (Signed)
Recheck cbc today.   

## 2018-11-04 NOTE — Assessment & Plan Note (Signed)
Followed by dermatology

## 2018-11-04 NOTE — Assessment & Plan Note (Signed)
Recheck cbc.  

## 2018-11-04 NOTE — Assessment & Plan Note (Signed)
Overall feels she is handling stress relatively well.  Does not feel needs anything more at this time.  Follow.

## 2018-12-19 ENCOUNTER — Other Ambulatory Visit: Payer: PPO

## 2018-12-26 ENCOUNTER — Other Ambulatory Visit: Payer: PPO

## 2019-01-08 ENCOUNTER — Encounter: Payer: Self-pay | Admitting: Internal Medicine

## 2019-01-08 NOTE — Telephone Encounter (Signed)
Patient stated she is having some chest pain. She is requesting blood work to check her heart. She says that she some times has sharp pain other times it is like a tightness. Last episode was this weekend and lasted about 2 minutes. Deep breathing did help. She has not seen cardiology in a few years. She is not having any chest pain or SOB at this time. Advised that she should not wait or send a my chart message any time that she is having chest pain or SOB. Also advised that blood work is not usually determined what is going on with someones heart. She would need EKG, etc. Patient verbalized understanding. Stated that she does not want to go to [facility]. Advised I would send message over to discuss with you since she is not actively having symptoms. Offered virtual visit. Pt would like to wait

## 2019-01-08 NOTE — Telephone Encounter (Signed)
Pt is going to [facility] urgent care

## 2019-01-08 NOTE — Telephone Encounter (Signed)
Agree. If having intermittent chest pain, needs evaluation with EKG, etc.  If not actively having pain, recommend [facility] urgent care.  Can get labs and ekg, etc.

## 2019-02-12 IMAGING — MG MM  DIGITAL SCREENING BREAST BILAT IMPLANT W/ TOMO W/ CAD
9 of 19 series · 9 of 35 positions shown · non-contrast
Comparison: Previous exam(s).

CLINICAL DATA: Screening.

EXAM:
DIGITAL SCREENING BILATERAL MAMMOGRAM WITH IMPLANTS, CAD AND ADJUNCT
TOMO
The patient has retroglandular implants. Standard and implant
displaced views were performed.

[L MLO (1 of 2)]
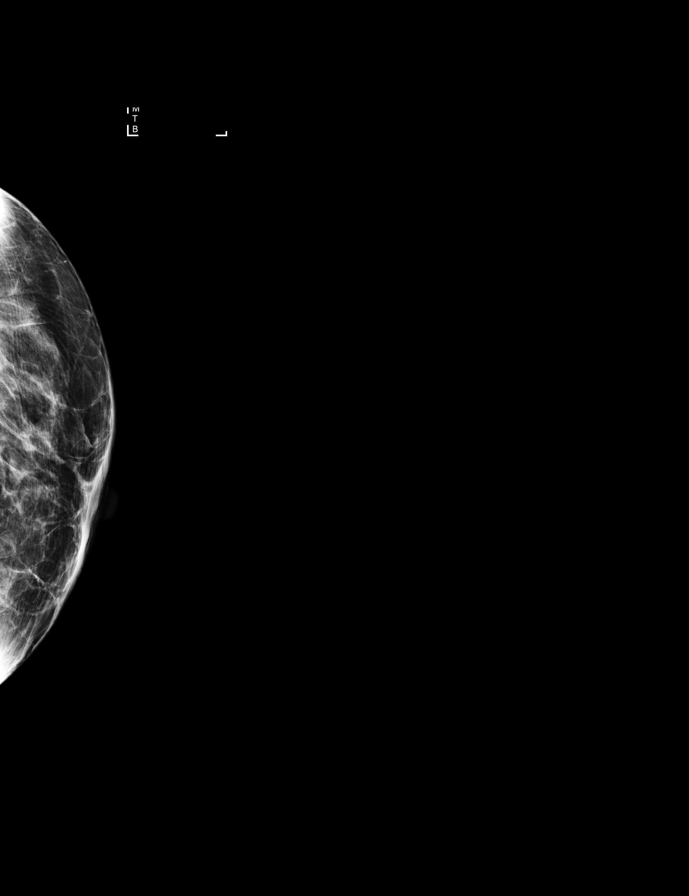

[R XCCL]
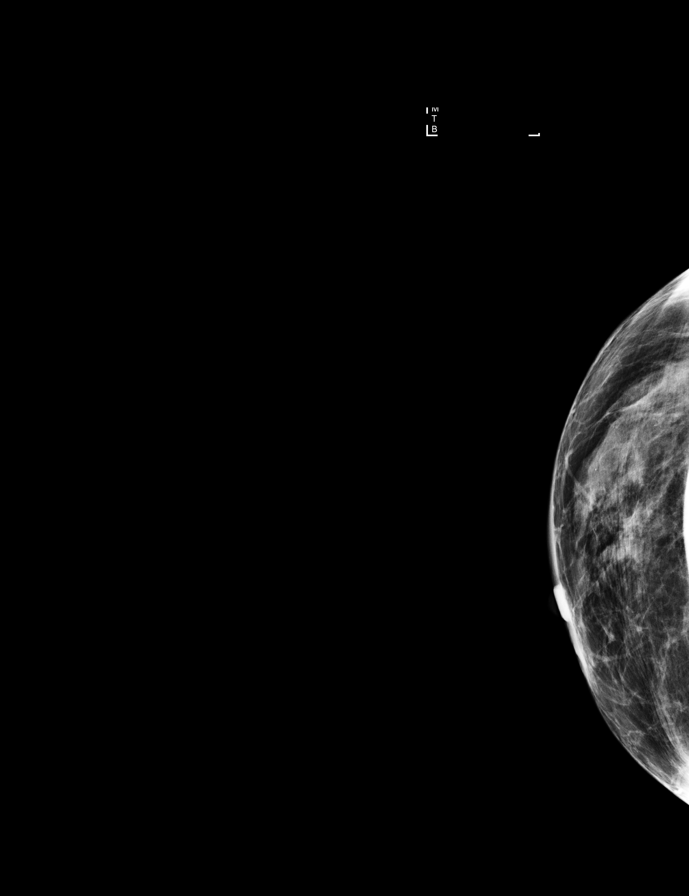

[R CC (1 of 2)]
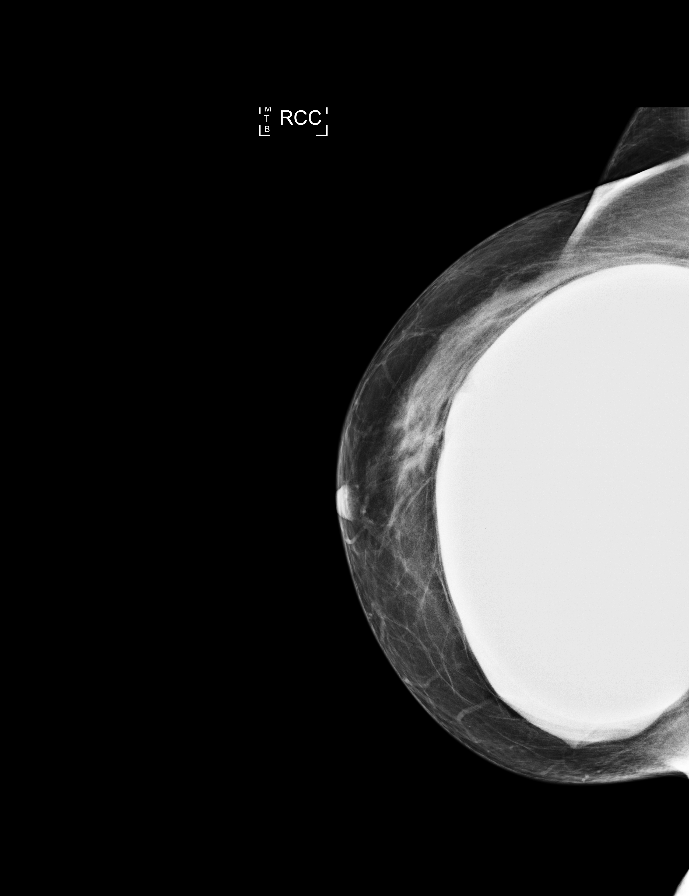

[L CC]
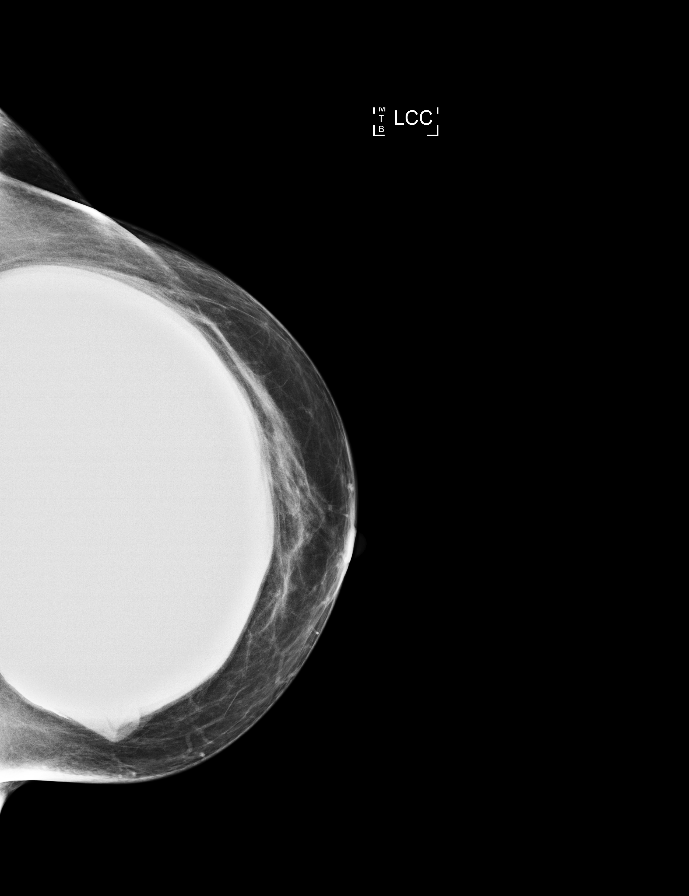

[L MLO (2 of 2)]
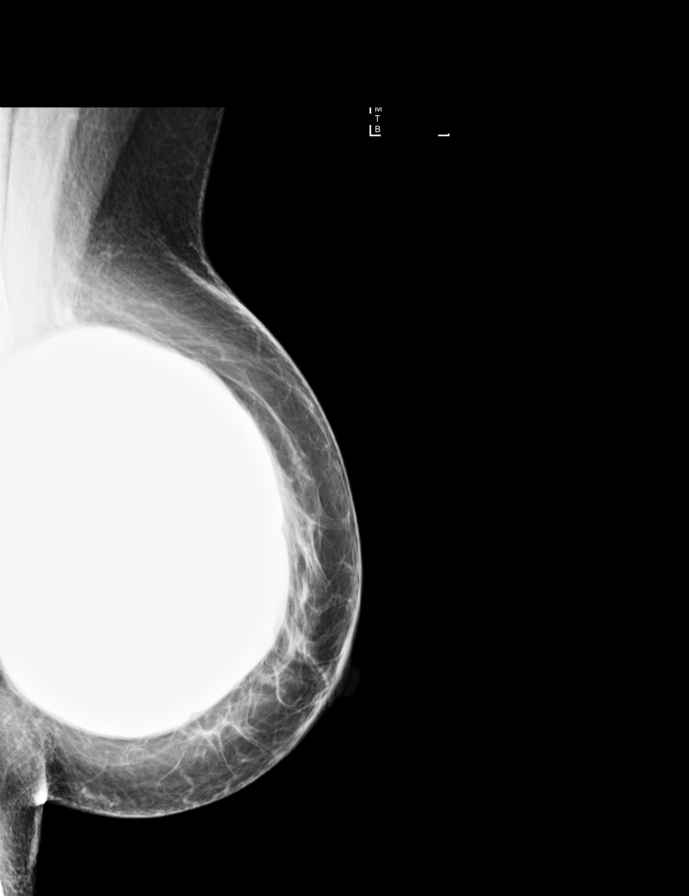

[R MLO (1 of 2)]
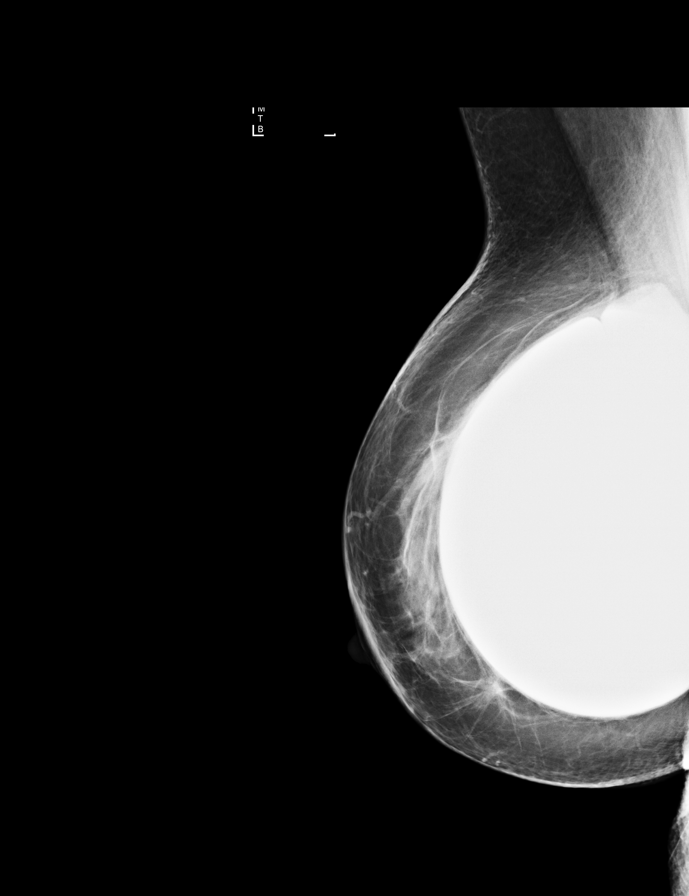

[R MLO (2 of 2)]
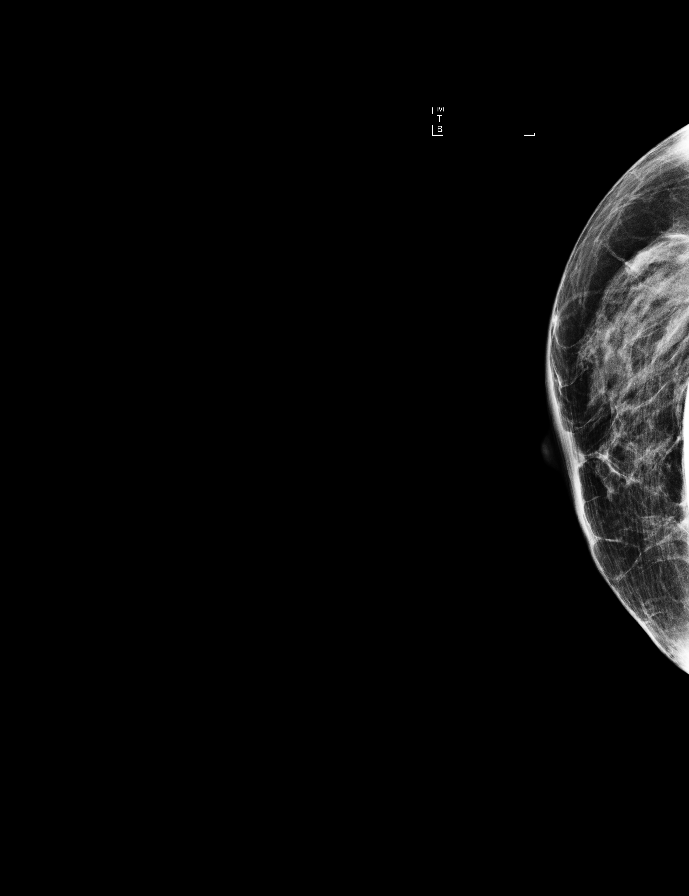

[R CC (2 of 2)]
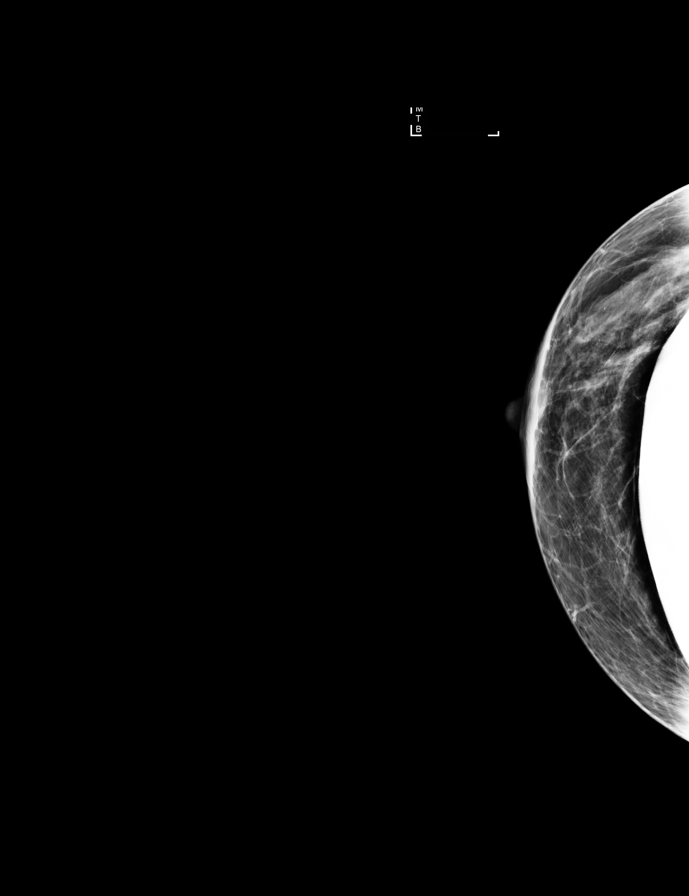

[R MLO synth-2D]
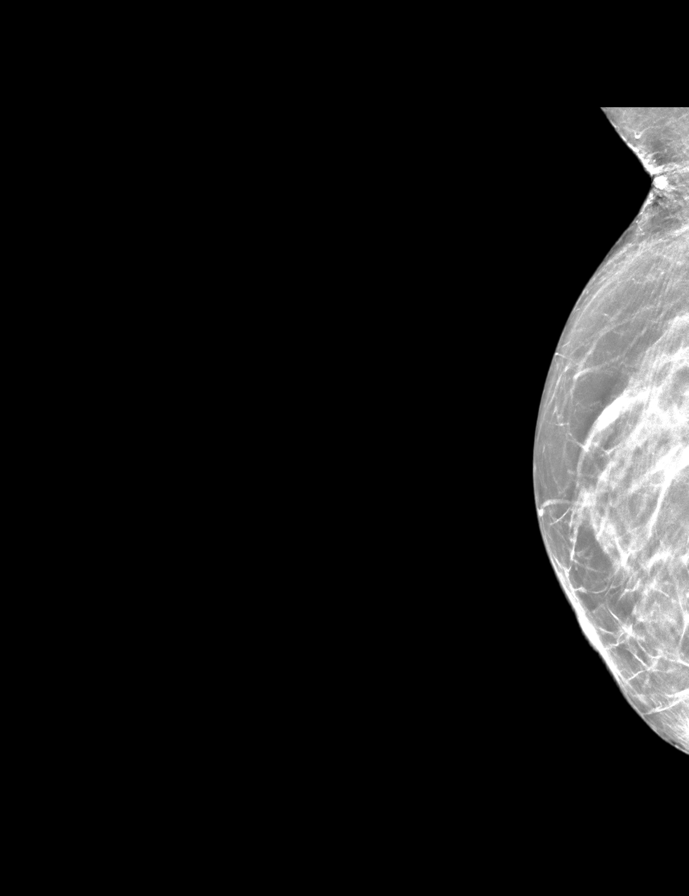

[9 of 35 positions shown; findings below may reference images not displayed]

ACR Breast Density Category b: There are scattered areas of
fibroglandular density.
FINDINGS: There are no findings suspicious for malignancy. Images were
processed with CAD.
IMPRESSION: No mammographic evidence of malignancy. A result letter of this
screening mammogram will be mailed directly to the patient.

RECOMMENDATION:
Screening mammogram in one year. (Code:3A-[id number])

BI-RADS CATEGORY  1:  Negative.

## 2019-02-13 ENCOUNTER — Ambulatory Visit
Admission: RE | Admit: 2019-02-13 | Discharge: 2019-02-13 | Disposition: A | Discharge status: Home / Self Care | Payer: PPO | Source: Ambulatory Visit | Attending: Internal Medicine | Admitting: Internal Medicine

## 2019-02-13 ENCOUNTER — Other Ambulatory Visit: Payer: Self-pay

## 2019-02-13 DIAGNOSIS — E2839 Other primary ovarian failure: Secondary | ICD-10-CM

## 2019-02-13 DIAGNOSIS — Z1231 Encounter for screening mammogram for malignant neoplasm of breast: Secondary | ICD-10-CM | POA: Diagnosis not present

## 2019-02-13 DIAGNOSIS — M85852 Other specified disorders of bone density and structure, left thigh: Secondary | ICD-10-CM | POA: Diagnosis not present

## 2019-04-04 ENCOUNTER — Ambulatory Visit (INDEPENDENT_AMBULATORY_CARE_PROVIDER_SITE_OTHER): Payer: PPO

## 2019-04-04 DIAGNOSIS — Z0000 Encounter for general adult medical examination without abnormal findings: Secondary | ICD-10-CM | POA: Diagnosis not present

## 2019-04-04 DIAGNOSIS — Z1159 Encounter for screening for other viral diseases: Secondary | ICD-10-CM

## 2019-04-04 NOTE — Patient Instructions (Addendum)
  Ms. Albano , Thank you for taking time to come for your Medicare Wellness Visit. I appreciate your ongoing commitment to your health goals. Please review the following plan we discussed and let me know if I can assist you in the future.   These are the goals we discussed: Goals    . Follow up with Primary Care Provider     Follow up in August with cpe and dexa scan result Take 2 calcium per day       This is a list of the screening recommended for you and due dates:  Health Maintenance  Topic Date Due  .  Hepatitis C: One time screening is recommended by Center for Disease Control  (CDC) for  adults born from [birth year] through 1964.   Oct 31, 1950  . Flu Shot  04/14/2019  . Pneumonia vaccines (2 of 2 - PPSV23) 11/03/2019  . Mammogram  02/13/2020  . Colon Cancer Screening  10/24/2022  . Tetanus Vaccine  01/26/2024  . DEXA scan (bone density measurement)  Completed
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
## 2019-04-24 ENCOUNTER — Encounter: Payer: Self-pay | Admitting: Internal Medicine

## 2019-04-26 DIAGNOSIS — D492 Neoplasm of unspecified behavior of bone, soft tissue, and skin: Secondary | ICD-10-CM | POA: Diagnosis not present

## 2019-04-26 DIAGNOSIS — Z85820 Personal history of malignant melanoma of skin: Secondary | ICD-10-CM | POA: Diagnosis not present

## 2019-04-26 DIAGNOSIS — D485 Neoplasm of uncertain behavior of skin: Secondary | ICD-10-CM | POA: Diagnosis not present

## 2019-04-26 DIAGNOSIS — C44319 Basal cell carcinoma of skin of other parts of face: Secondary | ICD-10-CM | POA: Diagnosis not present

## 2019-04-26 DIAGNOSIS — Z08 Encounter for follow-up examination after completed treatment for malignant neoplasm: Secondary | ICD-10-CM | POA: Diagnosis not present

## 2019-05-11 ENCOUNTER — Encounter: Payer: PPO | Admitting: Internal Medicine

## 2019-05-14 ENCOUNTER — Encounter: Payer: Self-pay | Admitting: Internal Medicine

## 2019-05-14 NOTE — Telephone Encounter (Signed)
Pt scheduled  

## 2019-05-18 ENCOUNTER — Other Ambulatory Visit: Payer: Self-pay

## 2019-05-18 ENCOUNTER — Ambulatory Visit (INDEPENDENT_AMBULATORY_CARE_PROVIDER_SITE_OTHER): Payer: PPO | Admitting: Internal Medicine

## 2019-05-18 DIAGNOSIS — F439 Reaction to severe stress, unspecified: Secondary | ICD-10-CM

## 2019-05-18 DIAGNOSIS — R2232 Localized swelling, mass and lump, left upper limb: Secondary | ICD-10-CM

## 2019-05-18 DIAGNOSIS — Z23 Encounter for immunization: Secondary | ICD-10-CM | POA: Diagnosis not present

## 2019-05-18 DIAGNOSIS — R2230 Localized swelling, mass and lump, unspecified upper limb: Secondary | ICD-10-CM | POA: Insufficient documentation
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
## 2019-05-22 ENCOUNTER — Encounter: Payer: Self-pay | Admitting: Internal Medicine

## 2019-05-22 NOTE — Assessment & Plan Note (Signed)
Exam as outlined.  Left axillary fullness/lump.  Schedule ultrasound - left axilla.  May need referral to surgery.  Start with ultrasound first.

## 2019-05-22 NOTE — Assessment & Plan Note (Signed)
States doing well.  Feels good.  Follow.

## 2019-05-28 ENCOUNTER — Other Ambulatory Visit: Payer: Self-pay | Admitting: Internal Medicine

## 2019-05-28 DIAGNOSIS — R2232 Localized swelling, mass and lump, left upper limb: Secondary | ICD-10-CM

## 2019-05-28 NOTE — Progress Notes (Signed)
Order placed for left breast diagnostic mammogram.

## 2019-05-29 ENCOUNTER — Encounter: Payer: Self-pay | Admitting: Internal Medicine

## 2019-06-08 ENCOUNTER — Ambulatory Visit
Admission: RE | Admit: 2019-06-08 | Discharge: 2019-06-08 | Disposition: A | Discharge status: Home / Self Care | Payer: PPO | Source: Ambulatory Visit | Attending: Internal Medicine | Admitting: Internal Medicine

## 2019-06-08 DIAGNOSIS — N6320 Unspecified lump in the left breast, unspecified quadrant: Secondary | ICD-10-CM | POA: Diagnosis not present

## 2019-06-08 DIAGNOSIS — R2232 Localized swelling, mass and lump, left upper limb: Secondary | ICD-10-CM | POA: Insufficient documentation

## 2019-06-08 DIAGNOSIS — N6489 Other specified disorders of breast: Secondary | ICD-10-CM | POA: Diagnosis not present

## 2019-06-08 DIAGNOSIS — R922 Inconclusive mammogram: Secondary | ICD-10-CM | POA: Diagnosis not present

## 2019-06-18 ENCOUNTER — Other Ambulatory Visit: Payer: Self-pay

## 2019-06-19 ENCOUNTER — Ambulatory Visit (INDEPENDENT_AMBULATORY_CARE_PROVIDER_SITE_OTHER): Payer: PPO | Admitting: Internal Medicine

## 2019-06-19 ENCOUNTER — Encounter: Payer: Self-pay | Admitting: Internal Medicine

## 2019-06-19 ENCOUNTER — Other Ambulatory Visit: Payer: Self-pay

## 2019-06-19 VITALS — BP 112/70 | HR 60 | Temp 95.7°F | Resp 16 | Wt 138.0 lb

## 2019-06-19 DIAGNOSIS — Z1159 Encounter for screening for other viral diseases: Secondary | ICD-10-CM | POA: Diagnosis not present

## 2019-06-19 DIAGNOSIS — D75839 Thrombocytosis, unspecified: Secondary | ICD-10-CM

## 2019-06-19 DIAGNOSIS — Z13220 Encounter for screening for lipoid disorders: Secondary | ICD-10-CM | POA: Diagnosis not present

## 2019-06-19 DIAGNOSIS — D649 Anemia, unspecified: Secondary | ICD-10-CM

## 2019-06-19 DIAGNOSIS — D473 Essential (hemorrhagic) thrombocythemia: Secondary | ICD-10-CM

## 2019-06-19 DIAGNOSIS — R2232 Localized swelling, mass and lump, left upper limb: Secondary | ICD-10-CM

## 2019-06-19 DIAGNOSIS — C439 Malignant melanoma of skin, unspecified: Secondary | ICD-10-CM | POA: Diagnosis not present

## 2019-06-19 DIAGNOSIS — F439 Reaction to severe stress, unspecified: Secondary | ICD-10-CM

## 2019-06-19 LAB — CBC WITH DIFFERENTIAL/PLATELET
Basophils Absolute: 0 10*3/uL (ref 0.0–0.1)
Basophils Relative: 0.6 % (ref 0.0–3.0)
Eosinophils Absolute: 0.1 10*3/uL (ref 0.0–0.7)
Eosinophils Relative: 1.3 % (ref 0.0–5.0)
HCT: 37 % (ref 36.0–46.0)
Hemoglobin: 12.3 g/dL (ref 12.0–15.0)
Lymphocytes Relative: 34.3 % (ref 12.0–46.0)
Lymphs Abs: 2.2 10*3/uL (ref 0.7–4.0)
MCHC: 33.2 g/dL (ref 30.0–36.0)
MCV: 95.4 fl (ref 78.0–100.0)
Monocytes Absolute: 0.7 10*3/uL (ref 0.1–1.0)
Monocytes Relative: 10.1 % (ref 3.0–12.0)
Neutro Abs: 3.5 10*3/uL (ref 1.4–7.7)
Neutrophils Relative %: 53.7 % (ref 43.0–77.0)
Platelets: 406 10*3/uL — ABNORMAL HIGH (ref 150.0–400.0)
RBC: 3.88 Mil/uL (ref 3.87–5.11)
RDW: 13.5 % (ref 11.5–15.5)
WBC: 6.5 10*3/uL (ref 4.0–10.5)

## 2019-06-19 LAB — BASIC METABOLIC PANEL
BUN: 10 mg/dL (ref 6–23)
CO2: 29 mEq/L (ref 19–32)
Calcium: 9.7 mg/dL (ref 8.4–10.5)
Chloride: 103 mEq/L (ref 96–112)
Creatinine, Ser: 0.67 mg/dL (ref 0.40–1.20)
GFR: 87.82 mL/min (ref >60.00)
Glucose, Bld: 88 mg/dL (ref 70–99)
Potassium: 4.6 mEq/L (ref 3.5–5.1)
Sodium: 140 mEq/L (ref 135–145)

## 2019-06-19 LAB — HEPATIC FUNCTION PANEL
ALT: 13 U/L (ref 0–35)
AST: 19 U/L (ref 0–37)
Albumin: 4.4 g/dL (ref 3.5–5.2)
Alkaline Phosphatase: 38 U/L — ABNORMAL LOW (ref 39–117)
Bilirubin, Direct: 0.1 mg/dL (ref 0.0–0.3)
Total Bilirubin: 0.5 mg/dL (ref 0.2–1.2)
Total Protein: 7.5 g/dL (ref 6.0–8.3)

## 2019-06-19 LAB — LIPID PANEL
Cholesterol: 197 mg/dL (ref 0–200)
HDL: 87 mg/dL (ref >39.00)
LDL Cholesterol: 97 mg/dL (ref 0–99)
NonHDL: 110.44
Total CHOL/HDL Ratio: 2
Triglycerides: 69 mg/dL (ref 0.0–149.0)
VLDL: 13.8 mg/dL (ref 0.0–40.0)

## 2019-06-19 LAB — FERRITIN: Ferritin: 39.8 ng/mL (ref 10.0–291.0)

## 2019-06-19 LAB — TSH: TSH: 1.87 u[IU]/mL (ref 0.35–4.50)
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
## 2019-06-20 ENCOUNTER — Encounter: Payer: Self-pay | Admitting: Internal Medicine

## 2019-06-20 LAB — HEPATITIS C ANTIBODY
Hepatitis C Ab: NONREACTIVE
SIGNAL TO CUT-OFF: 0.02 (ref <1.00)

## 2019-06-24 ENCOUNTER — Encounter: Payer: Self-pay | Admitting: Internal Medicine

## 2019-06-24 NOTE — Assessment & Plan Note (Signed)
Followed by dermatology

## 2019-06-24 NOTE — Assessment & Plan Note (Signed)
Recent mammogram ok.  No axillary mass/lump noted now.

## 2019-06-24 NOTE — Assessment & Plan Note (Signed)
Recheck cbc today.   

## 2019-06-24 NOTE — Assessment & Plan Note (Signed)
Increased stress as outlined.  Discussed with her today.  Overall she feels she is handling things relatively well.  Does not feel needs anything more at this time.  Follow.

## 2019-06-24 NOTE — Assessment & Plan Note (Signed)
Has had GI w/up as outlined.  Check cbc and ferritin.

## 2019-07-04 DIAGNOSIS — L988 Other specified disorders of the skin and subcutaneous tissue: Secondary | ICD-10-CM | POA: Diagnosis not present

## 2019-07-04 DIAGNOSIS — L578 Other skin changes due to chronic exposure to nonionizing radiation: Secondary | ICD-10-CM | POA: Diagnosis not present

## 2019-07-04 DIAGNOSIS — C44319 Basal cell carcinoma of skin of other parts of face: Secondary | ICD-10-CM | POA: Diagnosis not present

## 2019-07-04 DIAGNOSIS — Z85828 Personal history of other malignant neoplasm of skin: Secondary | ICD-10-CM | POA: Diagnosis not present

## 2019-08-06 DIAGNOSIS — H903 Sensorineural hearing loss, bilateral: Secondary | ICD-10-CM | POA: Diagnosis not present

## 2019-08-14 DIAGNOSIS — H903 Sensorineural hearing loss, bilateral: Secondary | ICD-10-CM | POA: Diagnosis not present

## 2019-09-24 ENCOUNTER — Ambulatory Visit: Payer: PPO | Admitting: Internal Medicine

## 2019-11-16 DIAGNOSIS — L905 Scar conditions and fibrosis of skin: Secondary | ICD-10-CM | POA: Diagnosis not present

## 2019-11-16 DIAGNOSIS — Z85828 Personal history of other malignant neoplasm of skin: Secondary | ICD-10-CM | POA: Diagnosis not present

## 2019-11-16 DIAGNOSIS — D225 Melanocytic nevi of trunk: Secondary | ICD-10-CM | POA: Diagnosis not present

## 2019-11-16 DIAGNOSIS — L814 Other melanin hyperpigmentation: Secondary | ICD-10-CM | POA: Diagnosis not present

## 2019-11-16 DIAGNOSIS — D485 Neoplasm of uncertain behavior of skin: Secondary | ICD-10-CM | POA: Diagnosis not present

## 2019-11-16 DIAGNOSIS — Z08 Encounter for follow-up examination after completed treatment for malignant neoplasm: Secondary | ICD-10-CM | POA: Diagnosis not present

## 2019-11-23 ENCOUNTER — Other Ambulatory Visit: Payer: Self-pay

## 2019-11-26 ENCOUNTER — Other Ambulatory Visit: Payer: Self-pay

## 2019-11-26 ENCOUNTER — Ambulatory Visit (INDEPENDENT_AMBULATORY_CARE_PROVIDER_SITE_OTHER): Payer: PPO | Admitting: Internal Medicine

## 2019-11-26 ENCOUNTER — Encounter: Payer: Self-pay | Admitting: Internal Medicine

## 2019-11-26 VITALS — BP 126/70 | HR 65 | Temp 96.6°F | Resp 16 | Ht 65.0 in | Wt 138.4 lb

## 2019-11-26 DIAGNOSIS — D75839 Thrombocytosis, unspecified: Secondary | ICD-10-CM

## 2019-11-26 DIAGNOSIS — D649 Anemia, unspecified: Secondary | ICD-10-CM | POA: Diagnosis not present

## 2019-11-26 DIAGNOSIS — D473 Essential (hemorrhagic) thrombocythemia: Secondary | ICD-10-CM | POA: Diagnosis not present

## 2019-11-26 DIAGNOSIS — C439 Malignant melanoma of skin, unspecified: Secondary | ICD-10-CM

## 2019-11-26 DIAGNOSIS — Z800 Family history of malignant neoplasm of digestive organs: Secondary | ICD-10-CM

## 2019-11-26 DIAGNOSIS — F439 Reaction to severe stress, unspecified: Secondary | ICD-10-CM

## 2019-11-26 DIAGNOSIS — Z23 Encounter for immunization: Secondary | ICD-10-CM | POA: Diagnosis not present

## 2019-11-26 LAB — CBC WITH DIFFERENTIAL/PLATELET
Basophils Absolute: 0.1 10*3/uL (ref 0.0–0.1)
Basophils Relative: 0.8 % (ref 0.0–3.0)
Eosinophils Absolute: 0.1 10*3/uL (ref 0.0–0.7)
Eosinophils Relative: 1.1 % (ref 0.0–5.0)
HCT: 35.2 % — ABNORMAL LOW (ref 36.0–46.0)
Hemoglobin: 12 g/dL (ref 12.0–15.0)
Lymphocytes Relative: 27.1 % (ref 12.0–46.0)
Lymphs Abs: 1.7 10*3/uL (ref 0.7–4.0)
MCHC: 34.1 g/dL (ref 30.0–36.0)
MCV: 96.1 fl (ref 78.0–100.0)
Monocytes Absolute: 0.6 10*3/uL (ref 0.1–1.0)
Monocytes Relative: 9.5 % (ref 3.0–12.0)
Neutro Abs: 3.8 10*3/uL (ref 1.4–7.7)
Neutrophils Relative %: 61.5 % (ref 43.0–77.0)
Platelets: 409 10*3/uL — ABNORMAL HIGH (ref 150.0–400.0)
RBC: 3.66 Mil/uL — ABNORMAL LOW (ref 3.87–5.11)
RDW: 12.7 % (ref 11.5–15.5)
WBC: 6.2 10*3/uL (ref 4.0–10.5)

## 2019-11-26 LAB — IBC + FERRITIN
Ferritin: 61.3 ng/mL (ref 10.0–291.0)
Iron: 79 ug/dL (ref 42–145)
Saturation Ratios: 20.9 % (ref 20.0–50.0)
Transferrin: 270 mg/dL (ref 212.0–360.0)
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
## 2019-11-27 ENCOUNTER — Encounter: Payer: Self-pay | Admitting: Internal Medicine

## 2019-12-02 ENCOUNTER — Encounter: Payer: Self-pay | Admitting: Internal Medicine

## 2019-12-02 NOTE — Assessment & Plan Note (Signed)
Follow cbc.  Platelet count has been stable.

## 2019-12-02 NOTE — Assessment & Plan Note (Signed)
Overall appears to be handling stress.  Does not feel needs any further intervention.  Follow.

## 2019-12-02 NOTE — Assessment & Plan Note (Signed)
Followed by dermatology

## 2019-12-02 NOTE — Assessment & Plan Note (Signed)
GI w/up previously as outlined.  Follow cbc and ferritin.

## 2019-12-02 NOTE — Assessment & Plan Note (Signed)
Colonoscopy 10/2017.  Continue f/u with GI.

## 2020-02-28 ENCOUNTER — Encounter: Payer: PPO | Admitting: Internal Medicine

## 2020-03-14 ENCOUNTER — Other Ambulatory Visit: Payer: Self-pay

## 2020-03-14 ENCOUNTER — Ambulatory Visit (INDEPENDENT_AMBULATORY_CARE_PROVIDER_SITE_OTHER): Payer: PPO | Admitting: Internal Medicine

## 2020-03-14 ENCOUNTER — Encounter: Payer: Self-pay | Admitting: Internal Medicine

## 2020-03-14 VITALS — BP 138/70 | HR 83 | Temp 98.2°F | Resp 16 | Ht 65.0 in | Wt 140.0 lb

## 2020-03-14 DIAGNOSIS — D649 Anemia, unspecified: Secondary | ICD-10-CM

## 2020-03-14 DIAGNOSIS — Z0000 Encounter for general adult medical examination without abnormal findings: Secondary | ICD-10-CM | POA: Diagnosis not present

## 2020-03-14 DIAGNOSIS — F439 Reaction to severe stress, unspecified: Secondary | ICD-10-CM

## 2020-03-14 DIAGNOSIS — Z85820 Personal history of malignant melanoma of skin: Secondary | ICD-10-CM

## 2020-03-14 DIAGNOSIS — Z800 Family history of malignant neoplasm of digestive organs: Secondary | ICD-10-CM | POA: Diagnosis not present

## 2020-03-14 DIAGNOSIS — R030 Elevated blood-pressure reading, without diagnosis of hypertension: Secondary | ICD-10-CM | POA: Diagnosis not present

## 2020-03-14 DIAGNOSIS — D473 Essential (hemorrhagic) thrombocythemia: Secondary | ICD-10-CM | POA: Diagnosis not present

## 2020-03-14 DIAGNOSIS — D75839 Thrombocytosis, unspecified: Secondary | ICD-10-CM

## 2020-03-14 NOTE — Assessment & Plan Note (Addendum)
Physical today 03/14/20.  PAP 02/11/16 - negative with negative HPV.  colonosocpy 10/24/17 - precancerous polyp.  Recommended f/u in 5 years.  Wants to hold on scheduling f/u mammogram.  Has breast implants.  Wants to get implants removed.  Wants to hold on mammogram until see if can get removed.  Will notify me when ready or if needs referral to surgery.  Wants to hold right now.
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
## 2020-03-16 ENCOUNTER — Encounter: Payer: Self-pay | Admitting: Internal Medicine

## 2020-03-16 DIAGNOSIS — R030 Elevated blood-pressure reading, without diagnosis of hypertension: Secondary | ICD-10-CM | POA: Insufficient documentation

## 2020-03-16 NOTE — Assessment & Plan Note (Signed)
Elevated blood pressure today.  See if she can spot check her pressure.  Follow pressures.  Get her back in soon to reassess.  Check metabolic panel.

## 2020-03-16 NOTE — Assessment & Plan Note (Signed)
Increased stress as outlined.  Discussed today.  Does not feel needs any further intervention.  Follow.

## 2020-03-16 NOTE — Assessment & Plan Note (Signed)
Followed by dermatology

## 2020-03-16 NOTE — Assessment & Plan Note (Signed)
Has GI w/up as outlined.  Follow cbc.

## 2020-03-16 NOTE — Assessment & Plan Note (Signed)
Follow cbc. Recheck with next labs.  Has been stable.

## 2020-03-16 NOTE — Assessment & Plan Note (Signed)
Colonoscopy 10/2017.  

## 2020-04-04 ENCOUNTER — Ambulatory Visit (INDEPENDENT_AMBULATORY_CARE_PROVIDER_SITE_OTHER): Payer: PPO

## 2020-04-04 VITALS — BP 126/70 | HR 65 | Ht 65.0 in | Wt 140.0 lb

## 2020-04-04 DIAGNOSIS — Z0000 Encounter for general adult medical examination without abnormal findings: Secondary | ICD-10-CM | POA: Diagnosis not present
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
## 2020-04-04 NOTE — Patient Instructions (Addendum)
Julie Porter , Thank you for taking time to come for your Medicare Wellness Visit. I appreciate your ongoing commitment to your health goals. Please review the following plan we discussed and let me know if I can assist you in the future.   These are the goals we discussed: Goals    . Follow up with Primary Care Provider     As needed       This is a list of the screening recommended for you and due dates:  Health Maintenance  Topic Date Due  . COVID-19 Vaccine (1) 04/20/2020*  . Mammogram  04/29/2020*  . Flu Shot  04/13/2020  . Colon Cancer Screening  10/24/2022  . Tetanus Vaccine  01/26/2024  . DEXA scan (bone density measurement)  Completed  .  Hepatitis C: One time screening is recommended by Center for Disease Control  (CDC) for  adults born from [birth year] through 1964.   Completed  . Pneumonia vaccines  Completed  *Topic was postponed. The date shown is not the original due date.    Immunizations Immunization History  Administered Date(s) Administered  . Fluad Quad(high Dose 65+) 05/18/2019  . Influenza, High Dose Seasonal PF 07/28/2018  . Influenza-Unspecified 06/13/2014, 06/18/2015, 06/03/2016  . Pneumococcal Conjugate-13 11/02/2018  . Pneumococcal Polysaccharide-23 11/26/2019  . Td 01/25/2014  . Zoster 06/18/2015    Keep all routine maintenance appointments.   Next scheduled lab 04/24/20 @ 9:30  Follow up  04/29/20 @ 2:30  Advanced directives: plans to update when available  Conditions/risks identified: none new   Follow up in one year for your annual wellness visit.   Preventive Care 33 Years and Older, Female Preventive care refers to lifestyle choices and visits with your health care provider that can promote health and wellness. What does preventive care include?  A yearly physical exam. This is also called an annual well check.  Dental exams once or twice a year.  Routine eye exams. Ask your health care provider how often you should have your eyes  checked.  Personal lifestyle choices, including:  Daily care of your teeth and gums.  Regular physical activity.  Eating a healthy diet.  Avoiding tobacco and drug use.  Limiting alcohol use.  Practicing safe sex.  Taking low-dose aspirin every day.  Taking vitamin and mineral supplements as recommended by your health care provider. What happens during an annual well check? The services and screenings done by your health care provider during your annual well check will depend on your age, overall health, lifestyle risk factors, and family history of disease. Counseling  Your health care provider may ask you questions about your:  Alcohol use.  Tobacco use.  Drug use.  Emotional well-being.  Home and relationship well-being.  Sexual activity.  Eating habits.  History of falls.  Memory and ability to understand (cognition).  Work and work Statistician.  Reproductive health. Screening  You may have the following tests or measurements:  Height, weight, and BMI.  Blood pressure.  Lipid and cholesterol levels. These may be checked every 5 years, or more frequently if you are over 47 years old.  Skin check.  Lung cancer screening. You may have this screening every year starting at age 62 if you have a 30-pack-year history of smoking and currently smoke or have quit within the past 15 years.  Fecal occult blood test (FOBT) of the stool. You may have this test every year starting at age 17.  Flexible sigmoidoscopy or colonoscopy. You may have  a sigmoidoscopy every 5 years or a colonoscopy every 10 years starting at age 87.  Hepatitis C blood test.  Hepatitis B blood test.  Sexually transmitted disease (STD) testing.  Diabetes screening. This is done by checking your blood sugar (glucose) after you have not eaten for a while (fasting). You may have this done every 1-3 years.  Bone density scan. This is done to screen for osteoporosis. You may have this done  starting at age 25.  Mammogram. This may be done every 1-2 years. Talk to your health care provider about how often you should have regular mammograms. Talk with your health care provider about your test results, treatment options, and if necessary, the need for more tests. Vaccines  Your health care provider may recommend certain vaccines, such as:  Influenza vaccine. This is recommended every year.  Tetanus, diphtheria, and acellular pertussis (Tdap, Td) vaccine. You may need a Td booster every 10 years.  Zoster vaccine. You may need this after age 27.  Pneumococcal 13-valent conjugate (PCV13) vaccine. One dose is recommended after age 71.  Pneumococcal polysaccharide (PPSV23) vaccine. One dose is recommended after age 32. Talk to your health care provider about which screenings and vaccines you need and how often you need them. This information is not intended to replace advice given to you by your health care provider. Make sure you discuss any questions you have with your health care provider. Document Released: 09/26/2015 Document Revised: 05/19/2016 Document Reviewed: 07/01/2015 Elsevier Interactive Patient Education  2017 [place] Prevention in the Home Falls can cause injuries. They can happen to people of all ages. There are many things you can do to make your home safe and to help prevent falls. What can I do on the outside of my home?  Regularly fix the edges of walkways and driveways and fix any cracks.  Remove anything that might make you trip as you walk through a door, such as a raised step or threshold.  Trim any bushes or trees on the path to your home.  Use bright outdoor lighting.  Clear any walking paths of anything that might make someone trip, such as rocks or tools.  Regularly check to see if handrails are loose or broken. Make sure that both sides of any steps have handrails.  Any raised decks and porches should have guardrails on the  edges.  Have any leaves, snow, or ice cleared regularly.  Use sand or salt on walking paths during winter.  Clean up any spills in your garage right away. This includes oil or grease spills. What can I do in the bathroom?  Use night lights.  Install grab bars by the toilet and in the tub and shower. Do not use towel bars as grab bars.  Use non-skid mats or decals in the tub or shower.  If you need to sit down in the shower, use a plastic, non-slip stool.  Keep the floor dry. Clean up any water that spills on the floor as soon as it happens.  Remove soap buildup in the tub or shower regularly.  Attach bath mats securely with double-sided non-slip rug tape.  Do not have throw rugs and other things on the floor that can make you trip. What can I do in the bedroom?  Use night lights.  Make sure that you have a light by your bed that is easy to reach.  Do not use any sheets or blankets that are too big for your bed.  They should not hang down onto the floor.  Have a firm chair that has side arms. You can use this for support while you get dressed.  Do not have throw rugs and other things on the floor that can make you trip. What can I do in the kitchen?  Clean up any spills right away.  Avoid walking on wet floors.  Keep items that you use a lot in easy-to-reach places.  If you need to reach something above you, use a strong step stool that has a grab bar.  Keep electrical cords out of the way.  Do not use floor polish or wax that makes floors slippery. If you must use wax, use non-skid floor wax.  Do not have throw rugs and other things on the floor that can make you trip. What can I do with my stairs?  Do not leave any items on the stairs.  Make sure that there are handrails on both sides of the stairs and use them. Fix handrails that are broken or loose. Make sure that handrails are as long as the stairways.  Check any carpeting to make sure that it is firmly  attached to the stairs. Fix any carpet that is loose or worn.  Avoid having throw rugs at the top or bottom of the stairs. If you do have throw rugs, attach them to the floor with carpet tape.  Make sure that you have a light switch at the top of the stairs and the bottom of the stairs. If you do not have them, ask someone to add them for you. What else can I do to help prevent falls?  Wear shoes that:  Do not have high heels.  Have rubber bottoms.  Are comfortable and fit you well.  Are closed at the toe. Do not wear sandals.  If you use a stepladder:  Make sure that it is fully opened. Do not climb a closed stepladder.  Make sure that both sides of the stepladder are locked into place.  Ask someone to hold it for you, if possible.  Clearly mark and make sure that you can see:  Any grab bars or handrails.  First and last steps.  Where the edge of each step is.  Use tools that help you move around (mobility aids) if they are needed. These include:  Canes.  Walkers.  Scooters.  Crutches.  Turn on the lights when you go into a dark area. Replace any light bulbs as soon as they burn out.  Set up your furniture so you have a clear path. Avoid moving your furniture around.  If any of your floors are uneven, fix them.  If there are any pets around you, be aware of where they are.  Review your medicines with your doctor. Some medicines can make you feel dizzy. This can increase your chance of falling. Ask your doctor what other things that you can do to help prevent falls. This information is not intended to replace advice given to you by your health care provider. Make sure you discuss any questions you have with your health care provider. Document Released: 06/26/2009 Document Revised: 02/05/2016 Document Reviewed: 10/04/2014 Elsevier Interactive Patient Education  2017 [employer].

## 2020-04-24 ENCOUNTER — Other Ambulatory Visit (INDEPENDENT_AMBULATORY_CARE_PROVIDER_SITE_OTHER): Payer: PPO

## 2020-04-24 ENCOUNTER — Other Ambulatory Visit: Payer: Self-pay

## 2020-04-24 DIAGNOSIS — D473 Essential (hemorrhagic) thrombocythemia: Secondary | ICD-10-CM | POA: Diagnosis not present

## 2020-04-24 DIAGNOSIS — Z85820 Personal history of malignant melanoma of skin: Secondary | ICD-10-CM

## 2020-04-24 DIAGNOSIS — R030 Elevated blood-pressure reading, without diagnosis of hypertension: Secondary | ICD-10-CM | POA: Diagnosis not present

## 2020-04-24 DIAGNOSIS — D649 Anemia, unspecified: Secondary | ICD-10-CM

## 2020-04-24 DIAGNOSIS — D75839 Thrombocytosis, unspecified: Secondary | ICD-10-CM

## 2020-04-24 LAB — IBC + FERRITIN
Ferritin: 73.6 ng/mL (ref 10.0–291.0)
Iron: 101 ug/dL (ref 42–145)
Saturation Ratios: 25.8 % (ref 20.0–50.0)
Transferrin: 280 mg/dL (ref 212.0–360.0)

## 2020-04-24 LAB — CBC WITH DIFFERENTIAL/PLATELET
Basophils Absolute: 0.1 10*3/uL (ref 0.0–0.1)
Basophils Relative: 1.2 % (ref 0.0–3.0)
Eosinophils Absolute: 0.1 10*3/uL (ref 0.0–0.7)
Eosinophils Relative: 2.2 % (ref 0.0–5.0)
HCT: 35.4 % — ABNORMAL LOW (ref 36.0–46.0)
Hemoglobin: 12 g/dL (ref 12.0–15.0)
Lymphocytes Relative: 30.3 % (ref 12.0–46.0)
Lymphs Abs: 1.9 10*3/uL (ref 0.7–4.0)
MCHC: 33.9 g/dL (ref 30.0–36.0)
MCV: 95.8 fl (ref 78.0–100.0)
Monocytes Absolute: 0.8 10*3/uL (ref 0.1–1.0)
Monocytes Relative: 12.8 % — ABNORMAL HIGH (ref 3.0–12.0)
Neutro Abs: 3.4 10*3/uL (ref 1.4–7.7)
Neutrophils Relative %: 53.5 % (ref 43.0–77.0)
Platelets: 377 10*3/uL (ref 150.0–400.0)
RBC: 3.69 Mil/uL — ABNORMAL LOW (ref 3.87–5.11)
RDW: 12.6 % (ref 11.5–15.5)
WBC: 6.3 10*3/uL (ref 4.0–10.5)

## 2020-04-24 LAB — LIPID PANEL
Cholesterol: 192 mg/dL (ref 0–200)
HDL: 89.2 mg/dL (ref >39.00)
LDL Cholesterol: 89 mg/dL (ref 0–99)
NonHDL: 102.65
Total CHOL/HDL Ratio: 2
Triglycerides: 69 mg/dL (ref 0.0–149.0)
VLDL: 13.8 mg/dL (ref 0.0–40.0)

## 2020-04-24 LAB — HEPATIC FUNCTION PANEL
ALT: 13 U/L (ref 0–35)
AST: 21 U/L (ref 0–37)
Albumin: 4.3 g/dL (ref 3.5–5.2)
Alkaline Phosphatase: 37 U/L — ABNORMAL LOW (ref 39–117)
Bilirubin, Direct: 0.1 mg/dL (ref 0.0–0.3)
Total Bilirubin: 0.6 mg/dL (ref 0.2–1.2)
Total Protein: 7.2 g/dL (ref 6.0–8.3)

## 2020-04-24 LAB — BASIC METABOLIC PANEL
BUN: 15 mg/dL (ref 6–23)
CO2: 29 mEq/L (ref 19–32)
Calcium: 9.5 mg/dL (ref 8.4–10.5)
Chloride: 101 mEq/L (ref 96–112)
Creatinine, Ser: 0.75 mg/dL (ref 0.40–1.20)
GFR: 76.9 mL/min (ref >60.00)
Glucose, Bld: 85 mg/dL (ref 70–99)
Potassium: 4.4 mEq/L (ref 3.5–5.1)
Sodium: 138 mEq/L (ref 135–145)

## 2020-04-29 ENCOUNTER — Other Ambulatory Visit: Payer: Self-pay

## 2020-04-29 ENCOUNTER — Ambulatory Visit (INDEPENDENT_AMBULATORY_CARE_PROVIDER_SITE_OTHER): Payer: PPO | Admitting: Internal Medicine

## 2020-04-29 DIAGNOSIS — D473 Essential (hemorrhagic) thrombocythemia: Secondary | ICD-10-CM

## 2020-04-29 DIAGNOSIS — R030 Elevated blood-pressure reading, without diagnosis of hypertension: Secondary | ICD-10-CM | POA: Diagnosis not present

## 2020-04-29 DIAGNOSIS — F439 Reaction to severe stress, unspecified: Secondary | ICD-10-CM | POA: Diagnosis not present

## 2020-04-29 DIAGNOSIS — Z85820 Personal history of malignant melanoma of skin: Secondary | ICD-10-CM

## 2020-04-29 DIAGNOSIS — Z800 Family history of malignant neoplasm of digestive organs: Secondary | ICD-10-CM

## 2020-04-29 DIAGNOSIS — D75839 Thrombocytosis, unspecified: Secondary | ICD-10-CM
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
## 2020-05-05 ENCOUNTER — Encounter: Payer: Self-pay | Admitting: Internal Medicine

## 2020-05-05 NOTE — Assessment & Plan Note (Signed)
Colonoscopy 10/2017.

## 2020-05-05 NOTE — Assessment & Plan Note (Signed)
Blood pressures as outlined.  Blood pressure recheck:  136/78.  Follow pressures.  Follow metabolic panel.

## 2020-05-05 NOTE — Assessment & Plan Note (Signed)
Increased stress. Discussed with her today.  Overall she feels she is handling things relatively well.

## 2020-05-05 NOTE — Assessment & Plan Note (Signed)
Platelet count wnl 04/24/20.  Follow.

## 2020-05-05 NOTE — Assessment & Plan Note (Signed)
Followed by dermatology

## 2020-05-21 DIAGNOSIS — Z85828 Personal history of other malignant neoplasm of skin: Secondary | ICD-10-CM | POA: Diagnosis not present

## 2020-05-21 DIAGNOSIS — Z85820 Personal history of malignant melanoma of skin: Secondary | ICD-10-CM | POA: Diagnosis not present

## 2020-05-21 DIAGNOSIS — D485 Neoplasm of uncertain behavior of skin: Secondary | ICD-10-CM | POA: Diagnosis not present

## 2020-05-21 DIAGNOSIS — D2272 Melanocytic nevi of left lower limb, including hip: Secondary | ICD-10-CM | POA: Diagnosis not present

## 2020-05-21 DIAGNOSIS — D2262 Melanocytic nevi of left upper limb, including shoulder: Secondary | ICD-10-CM | POA: Diagnosis not present

## 2020-05-21 DIAGNOSIS — D225 Melanocytic nevi of trunk: Secondary | ICD-10-CM | POA: Diagnosis not present

## 2020-09-18 ENCOUNTER — Encounter: Payer: Self-pay | Admitting: Internal Medicine

## 2020-09-18 ENCOUNTER — Other Ambulatory Visit: Payer: Self-pay

## 2020-09-18 ENCOUNTER — Ambulatory Visit (INDEPENDENT_AMBULATORY_CARE_PROVIDER_SITE_OTHER): Payer: Medicare HMO | Admitting: Internal Medicine

## 2020-09-18 DIAGNOSIS — L299 Pruritus, unspecified: Secondary | ICD-10-CM | POA: Diagnosis not present

## 2020-09-18 DIAGNOSIS — Z85820 Personal history of malignant melanoma of skin: Secondary | ICD-10-CM | POA: Diagnosis not present

## 2020-09-18 DIAGNOSIS — F439 Reaction to severe stress, unspecified: Secondary | ICD-10-CM | POA: Diagnosis not present

## 2020-09-18 DIAGNOSIS — Z800 Family history of malignant neoplasm of digestive organs: Secondary | ICD-10-CM | POA: Diagnosis not present

## 2020-09-18 DIAGNOSIS — D649 Anemia, unspecified: Secondary | ICD-10-CM
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
## 2020-09-20 ENCOUNTER — Encounter: Payer: Self-pay | Admitting: Internal Medicine

## 2020-09-20 DIAGNOSIS — L299 Pruritus, unspecified: Secondary | ICD-10-CM | POA: Insufficient documentation

## 2020-09-20 NOTE — Assessment & Plan Note (Signed)
Colonoscopy 2019.  Follow [facility].  

## 2020-09-20 NOTE — Assessment & Plan Note (Signed)
Colonoscopy 10/2017.  

## 2020-09-20 NOTE — Assessment & Plan Note (Signed)
Increased stress as outlined.  Discussed.  Doing some better now.  Follow.

## 2020-09-20 NOTE — Assessment & Plan Note (Signed)
Previous episodes of itching.  Resolved.  Unclear etiology. Discussed testing.  Wants to monitor.

## 2020-09-20 NOTE — Assessment & Plan Note (Signed)
Followed by dermatology

## 2020-09-23 ENCOUNTER — Encounter: Payer: Self-pay | Admitting: Internal Medicine

## 2020-09-23 NOTE — Telephone Encounter (Signed)
Need to confirm if she needs bactroban - or does her daughter.  If her daughter, then I can refill in her chart.

## 2020-10-21 NOTE — Telephone Encounter (Signed)
Need to confirm that lesions are better.  If no change with medication, will need evaluated.  Can send in a refill, just need to confirm improving.  Also, please put information in her daughter's chart - refill, etc.

## 2020-10-22 NOTE — Telephone Encounter (Signed)
Bactroban refilled in daughters chart.

## 2020-12-04 DIAGNOSIS — Z85828 Personal history of other malignant neoplasm of skin: Secondary | ICD-10-CM | POA: Diagnosis not present

## 2020-12-04 DIAGNOSIS — D485 Neoplasm of uncertain behavior of skin: Secondary | ICD-10-CM | POA: Diagnosis not present

## 2020-12-04 DIAGNOSIS — D2261 Melanocytic nevi of right upper limb, including shoulder: Secondary | ICD-10-CM | POA: Diagnosis not present

## 2020-12-04 DIAGNOSIS — D225 Melanocytic nevi of trunk: Secondary | ICD-10-CM | POA: Diagnosis not present

## 2020-12-04 DIAGNOSIS — D2272 Melanocytic nevi of left lower limb, including hip: Secondary | ICD-10-CM | POA: Diagnosis not present

## 2020-12-04 DIAGNOSIS — C44619 Basal cell carcinoma of skin of left upper limb, including shoulder: Secondary | ICD-10-CM | POA: Diagnosis not present

## 2020-12-04 DIAGNOSIS — Z85820 Personal history of malignant melanoma of skin: Secondary | ICD-10-CM | POA: Diagnosis not present

## 2020-12-11 ENCOUNTER — Other Ambulatory Visit: Payer: Self-pay

## 2020-12-11 ENCOUNTER — Ambulatory Visit (INDEPENDENT_AMBULATORY_CARE_PROVIDER_SITE_OTHER): Payer: Medicare HMO | Admitting: Internal Medicine

## 2020-12-11 VITALS — BP 122/78 | HR 61 | Temp 98.2°F | Resp 16 | Ht 65.0 in | Wt 134.0 lb

## 2020-12-11 DIAGNOSIS — D649 Anemia, unspecified: Secondary | ICD-10-CM | POA: Diagnosis not present

## 2020-12-11 DIAGNOSIS — D75839 Thrombocytosis, unspecified: Secondary | ICD-10-CM | POA: Diagnosis not present

## 2020-12-11 DIAGNOSIS — R1013 Epigastric pain: Secondary | ICD-10-CM | POA: Diagnosis not present

## 2020-12-11 DIAGNOSIS — Z13220 Encounter for screening for lipoid disorders: Secondary | ICD-10-CM

## 2020-12-11 DIAGNOSIS — R030 Elevated blood-pressure reading, without diagnosis of hypertension: Secondary | ICD-10-CM

## 2020-12-11 DIAGNOSIS — Z85820 Personal history of malignant melanoma of skin: Secondary | ICD-10-CM | POA: Diagnosis not present

## 2020-12-11 DIAGNOSIS — F439 Reaction to severe stress, unspecified: Secondary | ICD-10-CM
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
## 2020-12-12 LAB — LIPID PANEL
Cholesterol: 217 mg/dL — ABNORMAL HIGH (ref 0–200)
HDL: 90.9 mg/dL (ref >39.00)
LDL Cholesterol: 116 mg/dL — ABNORMAL HIGH (ref 0–99)
NonHDL: 126.21
Total CHOL/HDL Ratio: 2
Triglycerides: 49 mg/dL (ref 0.0–149.0)
VLDL: 9.8 mg/dL (ref 0.0–40.0)

## 2020-12-12 LAB — BASIC METABOLIC PANEL
BUN: 15 mg/dL (ref 6–23)
CO2: 31 mEq/L (ref 19–32)
Calcium: 9.8 mg/dL (ref 8.4–10.5)
Chloride: 101 mEq/L (ref 96–112)
Creatinine, Ser: 0.7 mg/dL (ref 0.40–1.20)
GFR: 88.89 mL/min (ref >60.00)
Glucose, Bld: 86 mg/dL (ref 70–99)
Potassium: 5.1 mEq/L (ref 3.5–5.1)
Sodium: 139 mEq/L (ref 135–145)

## 2020-12-12 LAB — HEPATIC FUNCTION PANEL
ALT: 13 U/L (ref 0–35)
AST: 19 U/L (ref 0–37)
Albumin: 4.5 g/dL (ref 3.5–5.2)
Alkaline Phosphatase: 38 U/L — ABNORMAL LOW (ref 39–117)
Bilirubin, Direct: 0.1 mg/dL (ref 0.0–0.3)
Total Bilirubin: 0.5 mg/dL (ref 0.2–1.2)
Total Protein: 7.3 g/dL (ref 6.0–8.3)

## 2020-12-12 LAB — TSH: TSH: 1.78 u[IU]/mL (ref 0.35–4.50)

## 2020-12-13 ENCOUNTER — Encounter: Payer: Self-pay | Admitting: Internal Medicine

## 2020-12-13 ENCOUNTER — Other Ambulatory Visit: Payer: Self-pay | Admitting: Internal Medicine

## 2020-12-13 DIAGNOSIS — E875 Hyperkalemia: Secondary | ICD-10-CM

## 2020-12-13 DIAGNOSIS — R1013 Epigastric pain: Secondary | ICD-10-CM

## 2020-12-13 NOTE — Assessment & Plan Note (Signed)
Check abdominal ultrasound.  Also check routine labs, including met b, liver panel.

## 2020-12-13 NOTE — Assessment & Plan Note (Signed)
Last platelet count wnl.  Follow.

## 2020-12-13 NOTE — Assessment & Plan Note (Signed)
Continue f/u with dermatology.  

## 2020-12-13 NOTE — Assessment & Plan Note (Signed)
Colonoscopy 2019. Recheck cbc today.

## 2020-12-13 NOTE — Progress Notes (Signed)
Order placed for f/u labs.  

## 2020-12-13 NOTE — Assessment & Plan Note (Signed)
Blood pressure as outlined.  Outside checks reviewed.  Continue to monitor.  Follow pressures.  Check metabolic panel.

## 2020-12-13 NOTE — Assessment & Plan Note (Signed)
Overall appears to be doing better.  Follow.  

## 2020-12-25 ENCOUNTER — Encounter: Payer: Self-pay | Admitting: Internal Medicine

## 2020-12-30 ENCOUNTER — Other Ambulatory Visit: Payer: Self-pay

## 2020-12-30 ENCOUNTER — Other Ambulatory Visit (INDEPENDENT_AMBULATORY_CARE_PROVIDER_SITE_OTHER): Payer: Medicare HMO

## 2020-12-30 DIAGNOSIS — R1013 Epigastric pain: Secondary | ICD-10-CM | POA: Diagnosis not present

## 2020-12-30 DIAGNOSIS — E875 Hyperkalemia: Secondary | ICD-10-CM

## 2020-12-30 LAB — CBC WITH DIFFERENTIAL/PLATELET
Basophils Absolute: 0 10*3/uL (ref 0.0–0.1)
Basophils Relative: 0.5 % (ref 0.0–3.0)
Eosinophils Absolute: 0.1 10*3/uL (ref 0.0–0.7)
Eosinophils Relative: 1.5 % (ref 0.0–5.0)
HCT: 36.7 % (ref 36.0–46.0)
Hemoglobin: 12.3 g/dL (ref 12.0–15.0)
Lymphocytes Relative: 36.3 % (ref 12.0–46.0)
Lymphs Abs: 2.5 10*3/uL (ref 0.7–4.0)
MCHC: 33.7 g/dL (ref 30.0–36.0)
MCV: 95.5 fl (ref 78.0–100.0)
Monocytes Absolute: 0.7 10*3/uL (ref 0.1–1.0)
Monocytes Relative: 10.3 % (ref 3.0–12.0)
Neutro Abs: 3.5 10*3/uL (ref 1.4–7.7)
Neutrophils Relative %: 51.4 % (ref 43.0–77.0)
Platelets: 443 10*3/uL — ABNORMAL HIGH (ref 150.0–400.0)
RBC: 3.84 Mil/uL — ABNORMAL LOW (ref 3.87–5.11)
RDW: 12.7 % (ref 11.5–15.5)
WBC: 6.9 10*3/uL (ref 4.0–10.5)

## 2020-12-30 LAB — POTASSIUM: Potassium: 4.6 mEq/L (ref 3.5–5.1)

## 2021-01-02 DIAGNOSIS — C44619 Basal cell carcinoma of skin of left upper limb, including shoulder: Secondary | ICD-10-CM | POA: Diagnosis not present

## 2021-01-16 ENCOUNTER — Ambulatory Visit: Payer: Medicare HMO

## 2021-01-19 ENCOUNTER — Other Ambulatory Visit: Payer: Self-pay

## 2021-01-19 ENCOUNTER — Ambulatory Visit
Admission: RE | Admit: 2021-01-19 | Discharge: 2021-01-19 | Disposition: A | Discharge status: Home / Self Care | Payer: Medicare HMO | Source: Ambulatory Visit | Attending: Internal Medicine | Admitting: Internal Medicine

## 2021-01-19 DIAGNOSIS — R1013 Epigastric pain: Secondary | ICD-10-CM | POA: Diagnosis not present

## 2021-03-12 ENCOUNTER — Ambulatory Visit (INDEPENDENT_AMBULATORY_CARE_PROVIDER_SITE_OTHER): Payer: Medicare HMO | Admitting: Internal Medicine

## 2021-03-12 ENCOUNTER — Encounter: Payer: Self-pay | Admitting: Internal Medicine

## 2021-03-12 ENCOUNTER — Other Ambulatory Visit: Payer: Self-pay

## 2021-03-12 VITALS — BP 122/76 | HR 64 | Temp 98.2°F | Ht 65.0 in | Wt 132.2 lb

## 2021-03-12 DIAGNOSIS — Z85820 Personal history of malignant melanoma of skin: Secondary | ICD-10-CM

## 2021-03-12 DIAGNOSIS — F439 Reaction to severe stress, unspecified: Secondary | ICD-10-CM

## 2021-03-12 DIAGNOSIS — D649 Anemia, unspecified: Secondary | ICD-10-CM

## 2021-03-12 DIAGNOSIS — Z800 Family history of malignant neoplasm of digestive organs: Secondary | ICD-10-CM | POA: Diagnosis not present

## 2021-03-12 DIAGNOSIS — D75839 Thrombocytosis, unspecified: Secondary | ICD-10-CM

## 2021-03-12 LAB — CBC WITH DIFFERENTIAL/PLATELET
Basophils Absolute: 0.1 10*3/uL (ref 0.0–0.1)
Basophils Relative: 1.3 % (ref 0.0–3.0)
Eosinophils Absolute: 0.2 10*3/uL (ref 0.0–0.7)
Eosinophils Relative: 2.4 % (ref 0.0–5.0)
HCT: 35.7 % — ABNORMAL LOW (ref 36.0–46.0)
Hemoglobin: 12 g/dL (ref 12.0–15.0)
Lymphocytes Relative: 29 % (ref 12.0–46.0)
Lymphs Abs: 1.8 10*3/uL (ref 0.7–4.0)
MCHC: 33.6 g/dL (ref 30.0–36.0)
MCV: 96.2 fl (ref 78.0–100.0)
Monocytes Absolute: 0.7 10*3/uL (ref 0.1–1.0)
Monocytes Relative: 10.7 % (ref 3.0–12.0)
Neutro Abs: 3.6 10*3/uL (ref 1.4–7.7)
Neutrophils Relative %: 56.6 % (ref 43.0–77.0)
Platelets: 399 10*3/uL (ref 150.0–400.0)
RBC: 3.71 Mil/uL — ABNORMAL LOW (ref 3.87–5.11)
RDW: 12.6 % (ref 11.5–15.5)
WBC: 6.4 10*3/uL (ref 4.0–10.5)

## 2021-03-12 LAB — IBC + FERRITIN
Ferritin: 85.9 ng/mL (ref 10.0–291.0)
Iron: 94 ug/dL (ref 42–145)
Saturation Ratios: 22.8 % (ref 20.0–50.0)
Transferrin: 294 mg/dL (ref 212.0–360.0)
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
## 2021-03-17 ENCOUNTER — Other Ambulatory Visit: Payer: Self-pay | Admitting: Internal Medicine

## 2021-03-17 ENCOUNTER — Encounter: Payer: Self-pay | Admitting: Internal Medicine

## 2021-03-17 DIAGNOSIS — Z1231 Encounter for screening mammogram for malignant neoplasm of breast: Secondary | ICD-10-CM

## 2021-03-17 NOTE — Assessment & Plan Note (Signed)
Follow up with dermatology.

## 2021-03-17 NOTE — Assessment & Plan Note (Signed)
Colonoscopy 2019.  Follow [facility].  

## 2021-03-17 NOTE — Assessment & Plan Note (Signed)
Slightly elevated platelet count last check.  Recheck today to confirm wnl.

## 2021-03-17 NOTE — Assessment & Plan Note (Signed)
Colonoscopy 2019.  

## 2021-03-17 NOTE — Assessment & Plan Note (Signed)
Increased stress.  Discussed.  Overall she appears to be handling things relatively well.  Does not feel needs any further intervention.  Follow.

## 2021-03-24 ENCOUNTER — Ambulatory Visit
Admission: RE | Admit: 2021-03-24 | Discharge: 2021-03-24 | Disposition: A | Discharge status: Home / Self Care | Payer: Medicare HMO | Source: Ambulatory Visit | Attending: Internal Medicine | Admitting: Internal Medicine

## 2021-03-24 ENCOUNTER — Other Ambulatory Visit: Payer: Self-pay

## 2021-03-24 ENCOUNTER — Other Ambulatory Visit: Payer: Self-pay | Admitting: Internal Medicine

## 2021-03-24 DIAGNOSIS — Z1231 Encounter for screening mammogram for malignant neoplasm of breast: Secondary | ICD-10-CM

## 2021-04-02 ENCOUNTER — Encounter: Payer: Self-pay | Admitting: Internal Medicine

## 2021-04-07 ENCOUNTER — Ambulatory Visit (INDEPENDENT_AMBULATORY_CARE_PROVIDER_SITE_OTHER): Payer: Medicare HMO

## 2021-04-07 VITALS — Ht 65.0 in | Wt 132.0 lb

## 2021-04-07 DIAGNOSIS — Z0000 Encounter for general adult medical examination without abnormal findings: Secondary | ICD-10-CM | POA: Diagnosis not present

## 2021-04-07 NOTE — Patient Instructions (Addendum)
  Julie Porter , Thank you for taking time to come for your Medicare Wellness Visit. I appreciate your ongoing commitment to your health goals. Please review the following plan we discussed and let me know if I can assist you in the future.   These are the goals we discussed:  Goals      Follow up with Primary Care Provider     As needed        This is a list of the screening recommended for you and due dates:  Health Maintenance  Topic Date Due   Zoster (Shingles) Vaccine (2 of 2) 06/12/2022*   Flu Shot  04/13/2021   Mammogram  03/24/2022   Colon Cancer Screening  10/24/2022   Tetanus Vaccine  01/26/2024   DEXA scan (bone density measurement)  Completed   Hepatitis C Screening: USPSTF Recommendation to screen - Ages 25-79 yo.  Completed   Pneumonia vaccines  Completed   HPV Vaccine  Aged Out   COVID-19 Vaccine  Discontinued  *Topic was postponed. The date shown is not the original due date.
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
## 2021-06-12 DIAGNOSIS — L821 Other seborrheic keratosis: Secondary | ICD-10-CM | POA: Diagnosis not present

## 2021-06-12 DIAGNOSIS — Z85828 Personal history of other malignant neoplasm of skin: Secondary | ICD-10-CM | POA: Diagnosis not present

## 2021-06-12 DIAGNOSIS — Z872 Personal history of diseases of the skin and subcutaneous tissue: Secondary | ICD-10-CM | POA: Diagnosis not present

## 2021-06-12 DIAGNOSIS — D485 Neoplasm of uncertain behavior of skin: Secondary | ICD-10-CM | POA: Diagnosis not present

## 2021-06-12 DIAGNOSIS — D225 Melanocytic nevi of trunk: Secondary | ICD-10-CM | POA: Diagnosis not present

## 2021-06-12 DIAGNOSIS — Z08 Encounter for follow-up examination after completed treatment for malignant neoplasm: Secondary | ICD-10-CM | POA: Diagnosis not present

## 2021-06-23 ENCOUNTER — Ambulatory Visit (INDEPENDENT_AMBULATORY_CARE_PROVIDER_SITE_OTHER): Payer: Medicare HMO | Admitting: Internal Medicine

## 2021-06-23 ENCOUNTER — Other Ambulatory Visit (HOSPITAL_COMMUNITY)
Admission: RE | Admit: 2021-06-23 | Discharge: 2021-06-23 | Disposition: A | Discharge status: Home / Self Care | Payer: Medicare HMO | Source: Ambulatory Visit | Attending: Internal Medicine | Admitting: Internal Medicine

## 2021-06-23 ENCOUNTER — Encounter: Payer: Self-pay | Admitting: Internal Medicine

## 2021-06-23 ENCOUNTER — Other Ambulatory Visit: Payer: Self-pay

## 2021-06-23 VITALS — BP 128/70 | HR 63 | Temp 97.6°F | Ht 64.9 in | Wt 132.6 lb

## 2021-06-23 DIAGNOSIS — Z124 Encounter for screening for malignant neoplasm of cervix: Secondary | ICD-10-CM

## 2021-06-23 DIAGNOSIS — D649 Anemia, unspecified: Secondary | ICD-10-CM

## 2021-06-23 DIAGNOSIS — Z01419 Encounter for gynecological examination (general) (routine) without abnormal findings: Secondary | ICD-10-CM | POA: Diagnosis not present

## 2021-06-23 DIAGNOSIS — D75839 Thrombocytosis, unspecified: Secondary | ICD-10-CM | POA: Diagnosis not present

## 2021-06-23 DIAGNOSIS — R030 Elevated blood-pressure reading, without diagnosis of hypertension: Secondary | ICD-10-CM

## 2021-06-23 DIAGNOSIS — Z85820 Personal history of malignant melanoma of skin: Secondary | ICD-10-CM | POA: Diagnosis not present

## 2021-06-23 DIAGNOSIS — Z0000 Encounter for general adult medical examination without abnormal findings: Secondary | ICD-10-CM

## 2021-06-23 DIAGNOSIS — Z1151 Encounter for screening for human papillomavirus (HPV): Secondary | ICD-10-CM | POA: Insufficient documentation

## 2021-06-23 DIAGNOSIS — F439 Reaction to severe stress, unspecified: Secondary | ICD-10-CM | POA: Diagnosis not present
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
## 2021-06-23 NOTE — Assessment & Plan Note (Addendum)
Physical today 06/23/21.  PAP 01/2016 - negative with negative HPV. Repeat pap today 06/23/21. Colonoscopy 10/2017.  Recommended f/u in 5 years.  Mammogram 03/27/21 - Birads I.

## 2021-06-24 LAB — CYTOLOGY - PAP
Comment: NEGATIVE
Diagnosis: NEGATIVE
High risk HPV: NEGATIVE

## 2021-06-28 ENCOUNTER — Encounter: Payer: Self-pay | Admitting: Internal Medicine

## 2021-06-28 NOTE — Assessment & Plan Note (Signed)
Increased stress.  Discussed.  Overall she appears to be handling things relatively well.  Does not feel needs any further intervention.  Follow.

## 2021-06-28 NOTE — Assessment & Plan Note (Signed)
Colonoscopy 2019.  Follow [facility].  

## 2021-06-28 NOTE — Assessment & Plan Note (Signed)
Have her spot check her blood pressure.  If persistent elevation, will start medication.  Follow.

## 2021-06-28 NOTE — Assessment & Plan Note (Signed)
Follow [facility] to confirm wnl.

## 2021-06-28 NOTE — Assessment & Plan Note (Signed)
Follow up with dermatology.

## 2021-06-29 DIAGNOSIS — L720 Epidermal cyst: Secondary | ICD-10-CM | POA: Diagnosis not present

## 2021-07-25 ENCOUNTER — Encounter: Payer: Self-pay | Admitting: Internal Medicine

## 2021-08-03 ENCOUNTER — Telehealth: Payer: Self-pay

## 2021-08-03 ENCOUNTER — Encounter: Payer: Self-pay | Admitting: Internal Medicine

## 2021-08-03 NOTE — Telephone Encounter (Signed)
Spoken to patient, she stated she is in a very stressful situation and Dr Nicki Reaper wanted to put her on BP medication previously. She would like to be worked in this week. She denies any SOB, Chest pX, Arm px, Jaw px, Blurry vision, Headaches, and Heart palpitations. Her blood pressure is just elevated and she believes she needs the medication now.   Patyient can be reach to schedule work in appointment @ [phone]

## 2021-08-03 NOTE — Telephone Encounter (Signed)
I am ok to work her in this week, but need to know if any other symptoms.  If any acute symptoms, she will need to be seen more acutely (today) and then I can f/u.  If no acute symptoms, I will work her in this week.  Can get with Larena Glassman to work out a time.

## 2021-08-03 NOTE — Telephone Encounter (Signed)
Left message for patient to return call back.  

## 2021-08-03 NOTE — Telephone Encounter (Signed)
Hi Dr. Nicki Reaper,   I think it's time I let you put me on something for my blood pressure. Things are very tense at home and my blood pressure was at 155/74 this morning. It's not normally that high but I can't seem to get it to stay under 130.  It's normally 130 something.   Thanks, Julie Porter

## 2021-08-04 NOTE — Telephone Encounter (Signed)
Patient has been scheduled

## 2021-08-05 ENCOUNTER — Ambulatory Visit (INDEPENDENT_AMBULATORY_CARE_PROVIDER_SITE_OTHER): Payer: Medicare HMO | Admitting: Internal Medicine

## 2021-08-05 ENCOUNTER — Other Ambulatory Visit: Payer: Self-pay

## 2021-08-05 ENCOUNTER — Encounter: Payer: Self-pay | Admitting: Internal Medicine

## 2021-08-05 VITALS — BP 142/90 | HR 69 | Temp 97.5°F | Resp 16 | Ht 65.0 in | Wt 132.4 lb

## 2021-08-05 DIAGNOSIS — D649 Anemia, unspecified: Secondary | ICD-10-CM

## 2021-08-05 DIAGNOSIS — Z800 Family history of malignant neoplasm of digestive organs: Secondary | ICD-10-CM | POA: Diagnosis not present

## 2021-08-05 DIAGNOSIS — F439 Reaction to severe stress, unspecified: Secondary | ICD-10-CM

## 2021-08-05 DIAGNOSIS — Z23 Encounter for immunization: Secondary | ICD-10-CM

## 2021-08-05 DIAGNOSIS — E7800 Pure hypercholesterolemia, unspecified: Secondary | ICD-10-CM | POA: Diagnosis not present

## 2021-08-05 DIAGNOSIS — Z13220 Encounter for screening for lipoid disorders: Secondary | ICD-10-CM

## 2021-08-05 DIAGNOSIS — I10 Essential (primary) hypertension: Secondary | ICD-10-CM | POA: Diagnosis not present

## 2021-08-05 DIAGNOSIS — D75839 Thrombocytosis, unspecified: Secondary | ICD-10-CM | POA: Diagnosis not present

## 2021-08-05 LAB — LIPID PANEL
Cholesterol: 228 mg/dL — ABNORMAL HIGH (ref 0–200)
HDL: 112 mg/dL (ref >39.00)
LDL Cholesterol: 106 mg/dL — ABNORMAL HIGH (ref 0–99)
NonHDL: 116.47
Total CHOL/HDL Ratio: 2
Triglycerides: 53 mg/dL (ref 0.0–149.0)
VLDL: 10.6 mg/dL (ref 0.0–40.0)

## 2021-08-05 LAB — BASIC METABOLIC PANEL
BUN: 18 mg/dL (ref 6–23)
CO2: 29 mEq/L (ref 19–32)
Calcium: 9.5 mg/dL (ref 8.4–10.5)
Chloride: 103 mEq/L (ref 96–112)
Creatinine, Ser: 0.62 mg/dL (ref 0.40–1.20)
GFR: 91.11 mL/min (ref >60.00)
Glucose, Bld: 90 mg/dL (ref 70–99)
Potassium: 4 mEq/L (ref 3.5–5.1)
Sodium: 141 mEq/L (ref 135–145)

## 2021-08-05 LAB — CBC WITH DIFFERENTIAL/PLATELET
Basophils Absolute: 0 10*3/uL (ref 0.0–0.1)
Basophils Relative: 0.5 % (ref 0.0–3.0)
Eosinophils Absolute: 0.1 10*3/uL (ref 0.0–0.7)
Eosinophils Relative: 1.1 % (ref 0.0–5.0)
HCT: 38.3 % (ref 36.0–46.0)
Hemoglobin: 12.7 g/dL (ref 12.0–15.0)
Lymphocytes Relative: 32.2 % (ref 12.0–46.0)
Lymphs Abs: 1.6 10*3/uL (ref 0.7–4.0)
MCHC: 33.1 g/dL (ref 30.0–36.0)
MCV: 96.5 fl (ref 78.0–100.0)
Monocytes Absolute: 0.6 10*3/uL (ref 0.1–1.0)
Monocytes Relative: 13.2 % — ABNORMAL HIGH (ref 3.0–12.0)
Neutro Abs: 2.6 10*3/uL (ref 1.4–7.7)
Neutrophils Relative %: 53 % (ref 43.0–77.0)
Platelets: 425 10*3/uL — ABNORMAL HIGH (ref 150.0–400.0)
RBC: 3.97 Mil/uL (ref 3.87–5.11)
RDW: 13.2 % (ref 11.5–15.5)
WBC: 4.8 10*3/uL (ref 4.0–10.5)

## 2021-08-05 LAB — HEPATIC FUNCTION PANEL
ALT: 14 U/L (ref 0–35)
AST: 22 U/L (ref 0–37)
Albumin: 4.6 g/dL (ref 3.5–5.2)
Alkaline Phosphatase: 40 U/L (ref 39–117)
Bilirubin, Direct: 0.1 mg/dL (ref 0.0–0.3)
Total Bilirubin: 0.5 mg/dL (ref 0.2–1.2)
Total Protein: 7.6 g/dL (ref 6.0–8.3)

## 2021-08-05 LAB — TSH: TSH: 1.81 u[IU]/mL (ref 0.35–5.50)

## 2021-08-05 MED ORDER — AMLODIPINE BESYLATE 2.5 MG PO TABS: 2.5000 mg | ORAL_TABLET | Freq: Every day | ORAL | 2 refills | Status: DC
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
## 2021-08-06 ENCOUNTER — Encounter: Payer: Self-pay | Admitting: Internal Medicine

## 2021-08-06 DIAGNOSIS — E7800 Pure hypercholesterolemia, unspecified: Secondary | ICD-10-CM | POA: Insufficient documentation

## 2021-08-06 DIAGNOSIS — I10 Essential (primary) hypertension: Secondary | ICD-10-CM | POA: Insufficient documentation

## 2021-08-06 NOTE — Assessment & Plan Note (Signed)
Colonoscopy 2019.  

## 2021-08-06 NOTE — Assessment & Plan Note (Signed)
Low cholesterol diet and exercise.  Follow lipid panel.   

## 2021-08-06 NOTE — Assessment & Plan Note (Signed)
Platelet count has been slightly elevated, but overall stable.  Continue to follow cbc.  Recheck cbc today.

## 2021-08-06 NOTE — Assessment & Plan Note (Signed)
Blood pressure remains elevated.  Start amlodipine 2.5mg  q day.  Follow pressures.  Follow metabolic panel.  Have her spot check her pressure and send in readings.  Wants to keep scheduled follow up, but will send in readings for review.

## 2021-08-06 NOTE — Assessment & Plan Note (Signed)
Increased stress as outlined.  Discussed.  Discussed counseling, medication, etc.  Does not feel needs any further intervention at this time.  Will notify me if changes her mind.

## 2021-08-06 NOTE — Assessment & Plan Note (Signed)
Colonoscopy 2019.  Recheck cbc today.

## 2021-09-30 ENCOUNTER — Other Ambulatory Visit: Payer: Self-pay

## 2021-09-30 ENCOUNTER — Ambulatory Visit (INDEPENDENT_AMBULATORY_CARE_PROVIDER_SITE_OTHER): Payer: Medicare HMO | Admitting: Internal Medicine

## 2021-09-30 ENCOUNTER — Encounter: Payer: Self-pay | Admitting: Internal Medicine

## 2021-09-30 DIAGNOSIS — E7800 Pure hypercholesterolemia, unspecified: Secondary | ICD-10-CM

## 2021-09-30 DIAGNOSIS — D75839 Thrombocytosis, unspecified: Secondary | ICD-10-CM

## 2021-09-30 DIAGNOSIS — Z85820 Personal history of malignant melanoma of skin: Secondary | ICD-10-CM | POA: Diagnosis not present

## 2021-09-30 DIAGNOSIS — F439 Reaction to severe stress, unspecified: Secondary | ICD-10-CM

## 2021-09-30 DIAGNOSIS — I10 Essential (primary) hypertension: Secondary | ICD-10-CM
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
## 2021-10-04 ENCOUNTER — Encounter: Payer: Self-pay | Admitting: Internal Medicine

## 2021-10-04 NOTE — Assessment & Plan Note (Signed)
Low cholesterol diet and exercise.  Follow lipid panel.   

## 2021-10-04 NOTE — Assessment & Plan Note (Signed)
Increased stress as outlined.  Discussed.  Discussed counseling, medication, etc.  Does not feel needs any further intervention at this time.  Will notify me if changes her mind.

## 2021-10-04 NOTE — Assessment & Plan Note (Signed)
Followed by dermatology

## 2021-10-04 NOTE — Assessment & Plan Note (Signed)
Platelet count has been slightly elevated, but overall stable.  Continue to follow cbc.

## 2021-10-04 NOTE — Assessment & Plan Note (Signed)
Blood pressure as outlined.  Continue amlodipine.  Discussed handling stress.  Hold on increasing amlodipine.  Follow pressures.  Follow metabolic panel.

## 2021-11-18 ENCOUNTER — Other Ambulatory Visit: Payer: Self-pay

## 2021-11-18 ENCOUNTER — Encounter: Payer: Self-pay | Admitting: Internal Medicine

## 2021-11-18 MED ORDER — AMLODIPINE BESYLATE 2.5 MG PO TABS: 2.5000 mg | ORAL_TABLET | Freq: Every day | ORAL | 2 refills | Status: DC

## 2021-12-23 DIAGNOSIS — Z09 Encounter for follow-up examination after completed treatment for conditions other than malignant neoplasm: Secondary | ICD-10-CM | POA: Diagnosis not present

## 2021-12-23 DIAGNOSIS — Z872 Personal history of diseases of the skin and subcutaneous tissue: Secondary | ICD-10-CM | POA: Diagnosis not present

## 2021-12-23 DIAGNOSIS — Z85828 Personal history of other malignant neoplasm of skin: Secondary | ICD-10-CM | POA: Diagnosis not present

## 2021-12-23 DIAGNOSIS — D2261 Melanocytic nevi of right upper limb, including shoulder: Secondary | ICD-10-CM | POA: Diagnosis not present

## 2021-12-23 DIAGNOSIS — Z85820 Personal history of malignant melanoma of skin: Secondary | ICD-10-CM | POA: Diagnosis not present

## 2021-12-23 DIAGNOSIS — D2272 Melanocytic nevi of left lower limb, including hip: Secondary | ICD-10-CM | POA: Diagnosis not present

## 2021-12-25 ENCOUNTER — Ambulatory Visit (INDEPENDENT_AMBULATORY_CARE_PROVIDER_SITE_OTHER): Payer: Medicare HMO | Admitting: Internal Medicine

## 2021-12-25 ENCOUNTER — Encounter: Payer: Self-pay | Admitting: Internal Medicine

## 2021-12-25 VITALS — BP 138/80 | HR 59 | Temp 97.6°F | Resp 16 | Ht 65.0 in | Wt 130.4 lb

## 2021-12-25 DIAGNOSIS — E7800 Pure hypercholesterolemia, unspecified: Secondary | ICD-10-CM | POA: Diagnosis not present

## 2021-12-25 DIAGNOSIS — Z85820 Personal history of malignant melanoma of skin: Secondary | ICD-10-CM

## 2021-12-25 DIAGNOSIS — I10 Essential (primary) hypertension: Secondary | ICD-10-CM | POA: Diagnosis not present

## 2021-12-25 DIAGNOSIS — D75839 Thrombocytosis, unspecified: Secondary | ICD-10-CM | POA: Diagnosis not present

## 2021-12-25 DIAGNOSIS — D649 Anemia, unspecified: Secondary | ICD-10-CM

## 2021-12-25 DIAGNOSIS — Z1231 Encounter for screening mammogram for malignant neoplasm of breast: Secondary | ICD-10-CM | POA: Diagnosis not present
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
## 2021-12-27 ENCOUNTER — Encounter: Payer: Self-pay | Admitting: Internal Medicine

## 2021-12-27 NOTE — Assessment & Plan Note (Signed)
Slightly increased platelet count last check.  Follow cbc.   ?

## 2021-12-27 NOTE — Assessment & Plan Note (Signed)
Colonoscopy 2019.  Follow [facility].  

## 2021-12-27 NOTE — Assessment & Plan Note (Signed)
Followed by dermatology

## 2021-12-27 NOTE — Assessment & Plan Note (Signed)
Blood pressure as outlined.  Continue amlodipine.  Pressures as outlined.  Discussed handling stress.  Follow pressures.  Follow metabolic panel.  ?

## 2021-12-27 NOTE — Assessment & Plan Note (Signed)
Low cholesterol diet and exercise.  Follow lipid panel.   

## 2022-02-17 ENCOUNTER — Other Ambulatory Visit: Payer: Self-pay

## 2022-02-17 ENCOUNTER — Encounter: Payer: Self-pay | Admitting: Internal Medicine

## 2022-02-17 MED ORDER — AMLODIPINE BESYLATE 2.5 MG PO TABS: 2.5000 mg | ORAL_TABLET | Freq: Every day | ORAL | 2 refills | Status: DC

## 2022-03-23 ENCOUNTER — Other Ambulatory Visit: Payer: Medicare HMO

## 2022-03-26 ENCOUNTER — Ambulatory Visit: Payer: Medicare HMO | Admitting: Internal Medicine

## 2022-04-08 ENCOUNTER — Ambulatory Visit (INDEPENDENT_AMBULATORY_CARE_PROVIDER_SITE_OTHER): Payer: Medicare HMO

## 2022-04-08 VITALS — BP 126/65 | Ht 65.0 in | Wt 127.0 lb

## 2022-04-08 DIAGNOSIS — Z0000 Encounter for general adult medical examination without abnormal findings: Secondary | ICD-10-CM
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
## 2022-04-08 NOTE — Patient Instructions (Addendum)
  Julie Porter , Thank you for taking time to come for your Medicare Wellness Visit. I appreciate your ongoing commitment to your health goals. Please review the following plan we discussed and let me know if I can assist you in the future.   These are the goals we discussed:  Goals      Follow up with Primary Care Provider     As needed.     Maintain Healthy Lifestyle     Weight goal about 125lb Stay active Healthy diet        This is a list of the screening recommended for you and due dates:  Health Maintenance  Topic Date Due   Mammogram  03/24/2022   Zoster (Shingles) Vaccine (2 of 2) 06/12/2022*   Flu Shot  04/13/2022   Colon Cancer Screening  10/24/2022   Tetanus Vaccine  01/26/2024   Pneumonia Vaccine  Completed   DEXA scan (bone density measurement)  Completed   Hepatitis C Screening: USPSTF Recommendation to screen - Ages 27-79 yo.  Completed   HPV Vaccine  Aged Out   COVID-19 Vaccine  Discontinued  *Topic was postponed. The date shown is not the original due date.

## 2022-04-16 ENCOUNTER — Other Ambulatory Visit: Payer: Self-pay | Admitting: Internal Medicine

## 2022-04-19 ENCOUNTER — Other Ambulatory Visit (INDEPENDENT_AMBULATORY_CARE_PROVIDER_SITE_OTHER): Payer: Medicare HMO

## 2022-04-19 DIAGNOSIS — I10 Essential (primary) hypertension: Secondary | ICD-10-CM

## 2022-04-19 DIAGNOSIS — D75839 Thrombocytosis, unspecified: Secondary | ICD-10-CM | POA: Diagnosis not present

## 2022-04-19 DIAGNOSIS — D649 Anemia, unspecified: Secondary | ICD-10-CM | POA: Diagnosis not present

## 2022-04-19 DIAGNOSIS — E7800 Pure hypercholesterolemia, unspecified: Secondary | ICD-10-CM | POA: Diagnosis not present

## 2022-04-19 LAB — CBC WITH DIFFERENTIAL/PLATELET
Basophils Absolute: 0 10*3/uL (ref 0.0–0.1)
Basophils Relative: 0.6 % (ref 0.0–3.0)
Eosinophils Absolute: 0.1 10*3/uL (ref 0.0–0.7)
Eosinophils Relative: 1.2 % (ref 0.0–5.0)
HCT: 36.6 % (ref 36.0–46.0)
Hemoglobin: 12.1 g/dL (ref 12.0–15.0)
Lymphocytes Relative: 24.3 % (ref 12.0–46.0)
Lymphs Abs: 1.6 10*3/uL (ref 0.7–4.0)
MCHC: 33.1 g/dL (ref 30.0–36.0)
MCV: 96.9 fl (ref 78.0–100.0)
Monocytes Absolute: 0.6 10*3/uL (ref 0.1–1.0)
Monocytes Relative: 9.8 % (ref 3.0–12.0)
Neutro Abs: 4.1 10*3/uL (ref 1.4–7.7)
Neutrophils Relative %: 64.1 % (ref 43.0–77.0)
Platelets: 375 10*3/uL (ref 150.0–400.0)
RBC: 3.78 Mil/uL — ABNORMAL LOW (ref 3.87–5.11)
RDW: 13 % (ref 11.5–15.5)
WBC: 6.4 10*3/uL (ref 4.0–10.5)

## 2022-04-19 LAB — BASIC METABOLIC PANEL
BUN: 20 mg/dL (ref 6–23)
CO2: 27 mEq/L (ref 19–32)
Calcium: 9.2 mg/dL (ref 8.4–10.5)
Chloride: 102 mEq/L (ref 96–112)
Creatinine, Ser: 0.74 mg/dL (ref 0.40–1.20)
GFR: 82.37 mL/min (ref >60.00)
Glucose, Bld: 82 mg/dL (ref 70–99)
Potassium: 4.4 mEq/L (ref 3.5–5.1)
Sodium: 142 mEq/L (ref 135–145)

## 2022-04-19 LAB — HEPATIC FUNCTION PANEL
ALT: 11 U/L (ref 0–35)
AST: 19 U/L (ref 0–37)
Albumin: 4.5 g/dL (ref 3.5–5.2)
Alkaline Phosphatase: 38 U/L — ABNORMAL LOW (ref 39–117)
Bilirubin, Direct: 0.1 mg/dL (ref 0.0–0.3)
Total Bilirubin: 0.5 mg/dL (ref 0.2–1.2)
Total Protein: 7.3 g/dL (ref 6.0–8.3)

## 2022-04-19 LAB — LIPID PANEL
Cholesterol: 234 mg/dL — ABNORMAL HIGH (ref 0–200)
HDL: 107.5 mg/dL (ref >39.00)
LDL Cholesterol: 114 mg/dL — ABNORMAL HIGH (ref 0–99)
NonHDL: 126.04
Total CHOL/HDL Ratio: 2
Triglycerides: 62 mg/dL (ref 0.0–149.0)
VLDL: 12.4 mg/dL (ref 0.0–40.0)

## 2022-04-19 LAB — TSH: TSH: 1.33 u[IU]/mL (ref 0.35–5.50)

## 2022-04-19 LAB — FERRITIN: Ferritin: 77.3 ng/mL (ref 10.0–291.0)

## 2022-04-19 NOTE — Addendum Note (Signed)
Addended by: Leeanne Rio on: 04/19/2022 10:29 AM   Modules accepted: Orders

## 2022-04-22 ENCOUNTER — Encounter: Payer: Self-pay | Admitting: Internal Medicine

## 2022-04-22 ENCOUNTER — Ambulatory Visit (INDEPENDENT_AMBULATORY_CARE_PROVIDER_SITE_OTHER): Payer: Medicare HMO | Admitting: Internal Medicine

## 2022-04-22 DIAGNOSIS — Z800 Family history of malignant neoplasm of digestive organs: Secondary | ICD-10-CM | POA: Diagnosis not present

## 2022-04-22 DIAGNOSIS — I10 Essential (primary) hypertension: Secondary | ICD-10-CM | POA: Diagnosis not present

## 2022-04-22 DIAGNOSIS — Z85820 Personal history of malignant melanoma of skin: Secondary | ICD-10-CM

## 2022-04-22 DIAGNOSIS — D75839 Thrombocytosis, unspecified: Secondary | ICD-10-CM | POA: Diagnosis not present

## 2022-04-22 DIAGNOSIS — F439 Reaction to severe stress, unspecified: Secondary | ICD-10-CM | POA: Diagnosis not present

## 2022-04-22 DIAGNOSIS — E7800 Pure hypercholesterolemia, unspecified: Secondary | ICD-10-CM | POA: Diagnosis not present

## 2022-04-22 DIAGNOSIS — D649 Anemia, unspecified: Secondary | ICD-10-CM

## 2022-04-22 DIAGNOSIS — R1013 Epigastric pain: Secondary | ICD-10-CM | POA: Diagnosis not present
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
## 2022-05-02 ENCOUNTER — Encounter: Payer: Self-pay | Admitting: Internal Medicine

## 2022-05-02 NOTE — Assessment & Plan Note (Signed)
Colonoscopy 2019.  

## 2022-05-02 NOTE — Assessment & Plan Note (Signed)
Followed by dermatology

## 2022-05-02 NOTE — Assessment & Plan Note (Signed)
Colonoscopy 2019.  Follow [facility].  

## 2022-05-02 NOTE — Assessment & Plan Note (Addendum)
Increased stress as outlined.  Discussed.  Have discussed counseling, medication, etc.  Does not feel needs any further intervention at this time.  Will notify me if changes her mind.

## 2022-05-02 NOTE — Assessment & Plan Note (Signed)
Follow cbc.  

## 2022-05-02 NOTE — Assessment & Plan Note (Signed)
Blood pressure as outlined.  Continue amlodipine.   Follow pressures.  Follow metabolic panel.  

## 2022-05-02 NOTE — Assessment & Plan Note (Signed)
Low cholesterol diet and exercise.  Follow lipid panel.   

## 2022-05-02 NOTE — Assessment & Plan Note (Signed)
Epigastric tenderness and fullness on exam.  Check CT scan.  Further w/up pending results.

## 2022-05-10 ENCOUNTER — Ambulatory Visit
Admission: RE | Admit: 2022-05-10 | Discharge: 2022-05-10 | Disposition: A | Discharge status: Home / Self Care | Payer: Medicare HMO | Source: Ambulatory Visit | Attending: Internal Medicine | Admitting: Internal Medicine

## 2022-05-10 DIAGNOSIS — N1330 Unspecified hydronephrosis: Secondary | ICD-10-CM | POA: Diagnosis not present

## 2022-05-10 DIAGNOSIS — R1013 Epigastric pain: Secondary | ICD-10-CM | POA: Insufficient documentation

## 2022-05-10 DIAGNOSIS — R109 Unspecified abdominal pain: Secondary | ICD-10-CM | POA: Diagnosis not present

## 2022-05-10 DIAGNOSIS — K760 Fatty (change of) liver, not elsewhere classified: Secondary | ICD-10-CM | POA: Diagnosis not present

## 2022-05-10 HISTORY — DX: Essential (primary) hypertension: I10

## 2022-05-10 MED ORDER — IOHEXOL 300 MG/ML  SOLN
100.0000 mL | Freq: Once | INTRAMUSCULAR | Status: AC | PRN
  Administered 2022-05-10: 100 mL via INTRAVENOUS

## 2022-05-15 ENCOUNTER — Other Ambulatory Visit: Payer: Self-pay | Admitting: Internal Medicine

## 2022-06-25 DIAGNOSIS — Z85828 Personal history of other malignant neoplasm of skin: Secondary | ICD-10-CM | POA: Diagnosis not present

## 2022-06-25 DIAGNOSIS — L942 Calcinosis cutis: Secondary | ICD-10-CM | POA: Diagnosis not present

## 2022-06-25 DIAGNOSIS — D485 Neoplasm of uncertain behavior of skin: Secondary | ICD-10-CM | POA: Diagnosis not present

## 2022-06-25 DIAGNOSIS — R208 Other disturbances of skin sensation: Secondary | ICD-10-CM | POA: Diagnosis not present

## 2022-06-25 DIAGNOSIS — D2272 Melanocytic nevi of left lower limb, including hip: Secondary | ICD-10-CM | POA: Diagnosis not present

## 2022-06-25 DIAGNOSIS — L570 Actinic keratosis: Secondary | ICD-10-CM | POA: Diagnosis not present

## 2022-06-25 DIAGNOSIS — L910 Hypertrophic scar: Secondary | ICD-10-CM | POA: Diagnosis not present

## 2022-06-25 DIAGNOSIS — D2261 Melanocytic nevi of right upper limb, including shoulder: Secondary | ICD-10-CM | POA: Diagnosis not present

## 2022-06-25 DIAGNOSIS — Z85820 Personal history of malignant melanoma of skin: Secondary | ICD-10-CM | POA: Diagnosis not present

## 2022-06-25 DIAGNOSIS — D225 Melanocytic nevi of trunk: Secondary | ICD-10-CM | POA: Diagnosis not present

## 2022-07-30 ENCOUNTER — Ambulatory Visit (INDEPENDENT_AMBULATORY_CARE_PROVIDER_SITE_OTHER): Payer: Medicare HMO | Admitting: Internal Medicine

## 2022-07-30 ENCOUNTER — Encounter: Payer: Self-pay | Admitting: Internal Medicine

## 2022-07-30 VITALS — BP 118/70 | HR 58 | Temp 98.1°F | Resp 16 | Ht 65.0 in | Wt 133.6 lb

## 2022-07-30 DIAGNOSIS — Z800 Family history of malignant neoplasm of digestive organs: Secondary | ICD-10-CM

## 2022-07-30 DIAGNOSIS — Z85820 Personal history of malignant melanoma of skin: Secondary | ICD-10-CM

## 2022-07-30 DIAGNOSIS — Z23 Encounter for immunization: Secondary | ICD-10-CM

## 2022-07-30 DIAGNOSIS — D649 Anemia, unspecified: Secondary | ICD-10-CM | POA: Diagnosis not present

## 2022-07-30 DIAGNOSIS — F439 Reaction to severe stress, unspecified: Secondary | ICD-10-CM

## 2022-07-30 DIAGNOSIS — E7800 Pure hypercholesterolemia, unspecified: Secondary | ICD-10-CM

## 2022-07-30 DIAGNOSIS — I10 Essential (primary) hypertension: Secondary | ICD-10-CM | POA: Diagnosis not present

## 2022-07-30 LAB — HEPATIC FUNCTION PANEL
ALT: 12 U/L (ref 0–35)
AST: 19 U/L (ref 0–37)
Albumin: 4.5 g/dL (ref 3.5–5.2)
Alkaline Phosphatase: 43 U/L (ref 39–117)
Bilirubin, Direct: 0.1 mg/dL (ref 0.0–0.3)
Total Bilirubin: 0.4 mg/dL (ref 0.2–1.2)
Total Protein: 7.4 g/dL (ref 6.0–8.3)

## 2022-07-30 LAB — LIPID PANEL
Cholesterol: 217 mg/dL — ABNORMAL HIGH (ref 0–200)
HDL: 105.1 mg/dL (ref >39.00)
LDL Cholesterol: 94 mg/dL (ref 0–99)
NonHDL: 112.09
Total CHOL/HDL Ratio: 2
Triglycerides: 91 mg/dL (ref 0.0–149.0)
VLDL: 18.2 mg/dL (ref 0.0–40.0)

## 2022-07-30 LAB — CBC WITH DIFFERENTIAL/PLATELET
Basophils Absolute: 0 10*3/uL (ref 0.0–0.1)
Basophils Relative: 0.4 % (ref 0.0–3.0)
Eosinophils Absolute: 0.1 10*3/uL (ref 0.0–0.7)
Eosinophils Relative: 1.3 % (ref 0.0–5.0)
HCT: 39.2 % (ref 36.0–46.0)
Hemoglobin: 13.1 g/dL (ref 12.0–15.0)
Lymphocytes Relative: 29.2 % (ref 12.0–46.0)
Lymphs Abs: 2.1 10*3/uL (ref 0.7–4.0)
MCHC: 33.5 g/dL (ref 30.0–36.0)
MCV: 95.4 fl (ref 78.0–100.0)
Monocytes Absolute: 0.8 10*3/uL (ref 0.1–1.0)
Monocytes Relative: 11.7 % (ref 3.0–12.0)
Neutro Abs: 4.1 10*3/uL (ref 1.4–7.7)
Neutrophils Relative %: 57.4 % (ref 43.0–77.0)
Platelets: 479 10*3/uL — ABNORMAL HIGH (ref 150.0–400.0)
RBC: 4.11 Mil/uL (ref 3.87–5.11)
RDW: 12.6 % (ref 11.5–15.5)
WBC: 7.2 10*3/uL (ref 4.0–10.5)

## 2022-07-30 LAB — BASIC METABOLIC PANEL
BUN: 16 mg/dL (ref 6–23)
CO2: 30 mEq/L (ref 19–32)
Calcium: 9.3 mg/dL (ref 8.4–10.5)
Chloride: 103 mEq/L (ref 96–112)
Creatinine, Ser: 0.63 mg/dL (ref 0.40–1.20)
GFR: 90.14 mL/min (ref >60.00)
Glucose, Bld: 70 mg/dL (ref 70–99)
Potassium: 4 mEq/L (ref 3.5–5.1)
Sodium: 140 mEq/L (ref 135–145)

## 2022-07-30 LAB — FERRITIN: Ferritin: 59.8 ng/mL (ref 10.0–291.0)
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
## 2022-08-01 ENCOUNTER — Encounter: Payer: Self-pay | Admitting: Internal Medicine

## 2022-08-01 NOTE — Assessment & Plan Note (Signed)
Followed by dermatology

## 2022-08-01 NOTE — Assessment & Plan Note (Signed)
Increased stress as outlined.  Discussed. Does not feel needs any further intervention at this time.  Will notify me if changes her mind.

## 2022-08-01 NOTE — Assessment & Plan Note (Signed)
Colonoscopy 2019.

## 2022-08-01 NOTE — Assessment & Plan Note (Signed)
Blood pressure as outlined.  Continue amlodipine.   Follow pressures.  Follow metabolic panel.

## 2022-08-01 NOTE — Assessment & Plan Note (Signed)
Colonoscopy 2019.  Follow cbc.

## 2022-08-01 NOTE — Assessment & Plan Note (Signed)
Low cholesterol diet and exercise.  Follow lipid panel.   

## 2022-08-02 ENCOUNTER — Other Ambulatory Visit: Payer: Self-pay

## 2022-08-02 DIAGNOSIS — D75839 Thrombocytosis, unspecified: Secondary | ICD-10-CM

## 2022-09-22 ENCOUNTER — Other Ambulatory Visit (INDEPENDENT_AMBULATORY_CARE_PROVIDER_SITE_OTHER): Payer: Medicare HMO

## 2022-09-22 DIAGNOSIS — D75839 Thrombocytosis, unspecified: Secondary | ICD-10-CM | POA: Diagnosis not present

## 2022-09-22 LAB — CBC WITH DIFFERENTIAL/PLATELET
Basophils Absolute: 0.1 10*3/uL (ref 0.0–0.1)
Basophils Relative: 1.5 % (ref 0.0–3.0)
Eosinophils Absolute: 0.1 10*3/uL (ref 0.0–0.7)
Eosinophils Relative: 1.6 % (ref 0.0–5.0)
HCT: 36.9 % (ref 36.0–46.0)
Hemoglobin: 12.4 g/dL (ref 12.0–15.0)
Lymphocytes Relative: 21.4 % (ref 12.0–46.0)
Lymphs Abs: 2 10*3/uL (ref 0.7–4.0)
MCHC: 33.5 g/dL (ref 30.0–36.0)
MCV: 95.7 fl (ref 78.0–100.0)
Monocytes Absolute: 0.9 10*3/uL (ref 0.1–1.0)
Monocytes Relative: 9.8 % (ref 3.0–12.0)
Neutro Abs: 6.1 10*3/uL (ref 1.4–7.7)
Neutrophils Relative %: 65.7 % (ref 43.0–77.0)
Platelets: 509 10*3/uL — ABNORMAL HIGH (ref 150.0–400.0)
RBC: 3.85 Mil/uL — ABNORMAL LOW (ref 3.87–5.11)
RDW: 12.8 % (ref 11.5–15.5)
WBC: 9.2 10*3/uL (ref 4.0–10.5)

## 2022-09-24 ENCOUNTER — Other Ambulatory Visit: Payer: Self-pay

## 2022-09-24 ENCOUNTER — Telehealth: Payer: Self-pay | Admitting: Internal Medicine

## 2022-09-24 DIAGNOSIS — D75839 Thrombocytosis, unspecified: Secondary | ICD-10-CM

## 2022-09-24 NOTE — Telephone Encounter (Signed)
See result note

## 2022-09-24 NOTE — Telephone Encounter (Signed)
Pt returning call about lab work

## 2022-11-05 ENCOUNTER — Other Ambulatory Visit (INDEPENDENT_AMBULATORY_CARE_PROVIDER_SITE_OTHER): Payer: Medicare HMO

## 2022-11-05 DIAGNOSIS — D75839 Thrombocytosis, unspecified: Secondary | ICD-10-CM | POA: Diagnosis not present

## 2022-11-05 LAB — CBC WITH DIFFERENTIAL/PLATELET
Basophils Absolute: 0 10*3/uL (ref 0.0–0.1)
Basophils Relative: 0.7 % (ref 0.0–3.0)
Eosinophils Absolute: 0.1 10*3/uL (ref 0.0–0.7)
Eosinophils Relative: 1.7 % (ref 0.0–5.0)
HCT: 38.5 % (ref 36.0–46.0)
Hemoglobin: 13 g/dL (ref 12.0–15.0)
Lymphocytes Relative: 31.6 % (ref 12.0–46.0)
Lymphs Abs: 2 10*3/uL (ref 0.7–4.0)
MCHC: 33.7 g/dL (ref 30.0–36.0)
MCV: 95 fl (ref 78.0–100.0)
Monocytes Absolute: 0.6 10*3/uL (ref 0.1–1.0)
Monocytes Relative: 9.7 % (ref 3.0–12.0)
Neutro Abs: 3.7 10*3/uL (ref 1.4–7.7)
Neutrophils Relative %: 56.3 % (ref 43.0–77.0)
Platelets: 430 10*3/uL — ABNORMAL HIGH (ref 150.0–400.0)
RBC: 4.05 Mil/uL (ref 3.87–5.11)
RDW: 13.1 % (ref 11.5–15.5)
WBC: 6.5 10*3/uL (ref 4.0–10.5)

## 2022-11-08 ENCOUNTER — Telehealth: Payer: Self-pay

## 2022-11-08 NOTE — Telephone Encounter (Signed)
LM for pt to cb re ::  Einar Pheasant, MD  [facility] Please call and notify - platelet count has improved from recent checks.  We will continue to follow.  Will schedule f/u lab at her next appt.

## 2022-11-14 ENCOUNTER — Other Ambulatory Visit: Payer: Self-pay | Admitting: Family

## 2022-11-29 ENCOUNTER — Ambulatory Visit (INDEPENDENT_AMBULATORY_CARE_PROVIDER_SITE_OTHER): Payer: Medicare HMO | Admitting: Internal Medicine

## 2022-11-29 ENCOUNTER — Encounter: Payer: Self-pay | Admitting: Internal Medicine

## 2022-11-29 VITALS — BP 132/70 | HR 73 | Temp 98.0°F | Resp 16 | Ht 65.0 in | Wt 133.8 lb

## 2022-11-29 DIAGNOSIS — Z1211 Encounter for screening for malignant neoplasm of colon: Secondary | ICD-10-CM

## 2022-11-29 DIAGNOSIS — F439 Reaction to severe stress, unspecified: Secondary | ICD-10-CM | POA: Diagnosis not present

## 2022-11-29 DIAGNOSIS — I10 Essential (primary) hypertension: Secondary | ICD-10-CM

## 2022-11-29 DIAGNOSIS — Z85820 Personal history of malignant melanoma of skin: Secondary | ICD-10-CM

## 2022-11-29 DIAGNOSIS — D649 Anemia, unspecified: Secondary | ICD-10-CM | POA: Diagnosis not present

## 2022-11-29 DIAGNOSIS — D75839 Thrombocytosis, unspecified: Secondary | ICD-10-CM

## 2022-11-29 DIAGNOSIS — E7800 Pure hypercholesterolemia, unspecified: Secondary | ICD-10-CM | POA: Diagnosis not present

## 2022-11-29 DIAGNOSIS — Z0000 Encounter for general adult medical examination without abnormal findings: Secondary | ICD-10-CM | POA: Diagnosis not present

## 2022-11-29 DIAGNOSIS — Z800 Family history of malignant neoplasm of digestive organs: Secondary | ICD-10-CM
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
## 2022-11-29 NOTE — Assessment & Plan Note (Addendum)
Physical today 11/29/22.   PAP 01/2016 - negative with negative HPV. Repeat pap 06/23/21 - negative with negative HPV.  Colonoscopy 10/2017.  Recommended f/u in 5 years.  Order placed for referral.  Mammogram 03/27/21 - Birads I. Overdue mammogram.  Discussed.  Continues to want to hold on mammogram.  Will notify me when she is agreeable to schedule.

## 2022-11-30 ENCOUNTER — Other Ambulatory Visit: Payer: Self-pay

## 2022-11-30 ENCOUNTER — Telehealth: Payer: Self-pay

## 2022-11-30 DIAGNOSIS — Z800 Family history of malignant neoplasm of digestive organs: Secondary | ICD-10-CM

## 2022-11-30 DIAGNOSIS — Z86010 Personal history of colonic polyps: Secondary | ICD-10-CM

## 2022-11-30 NOTE — Telephone Encounter (Signed)
Gastroenterology Pre-Procedure Review  Request Date: 04/25/23 Requesting Physician: Dr. Allen Norris  PATIENT REVIEW QUESTIONS: The patient responded to the following health history questions as indicated:    1. Are you having any GI issues? no 2. Do you have a personal history of Polyps? yes (5mm polyp noted on colonoscopy performed by Dr. Allen Norris on 10/24/2017) 3. Do you have a family history of Colon Cancer or Polyps? yes (father colon cancer) 4. Diabetes Mellitus? no 5. Joint replacements in the past 12 months?no 6. Major health problems in the past 3 months?no 7. Any artificial heart valves, MVP, or defibrillator?no    MEDICATIONS & ALLERGIES:    Patient reports the following regarding taking any anticoagulation/antiplatelet therapy:   Plavix, Coumadin, Eliquis, Xarelto, Lovenox, Pradaxa, Brilinta, or Effient? no Aspirin? no  Patient confirms/reports the following medications:  Current Outpatient Medications  Medication Sig Dispense Refill   amLODipine (NORVASC) 2.5 MG tablet TAKE 1 TABLET(2.5 MG) BY MOUTH DAILY 90 tablet 1   Multiple Vitamins-Minerals (MULTIVITAMIN PO) Take 1 capsule by mouth daily.      Turmeric 500 MG TABS Take by mouth.     No current facility-administered medications for this visit.    Patient confirms/reports the following allergies:  Allergies  Allergen Reactions   Epinephrine Other (See Comments)    Heart races   Penicillins Hives   Polysporin [Bacitracin-Polymyxin B] Other (See Comments)    Wound oozes    No orders of the defined types were placed in this encounter.   AUTHORIZATION INFORMATION Primary Insurance: 1D#: Group #:  Secondary Insurance: 1D#: Group #:  SCHEDULE INFORMATION: Date: 04/25/23 Time: Location: [facility]

## 2022-12-05 ENCOUNTER — Encounter: Payer: Self-pay | Admitting: Internal Medicine

## 2022-12-05 NOTE — Assessment & Plan Note (Signed)
Colonoscopy 2019. Due f/u colonoscopy.

## 2022-12-05 NOTE — Assessment & Plan Note (Signed)
Low cholesterol diet and exercise.  Follow lipid panel.   

## 2022-12-05 NOTE — Assessment & Plan Note (Signed)
Followed by dermatology

## 2022-12-05 NOTE — Assessment & Plan Note (Signed)
Follow cbc.  

## 2022-12-05 NOTE — Assessment & Plan Note (Signed)
Increased stress as outlined.  Discussed. Does not feel needs any further intervention at this time.  Will notify me if changes her mind.  

## 2022-12-05 NOTE — Assessment & Plan Note (Signed)
Continue amlodipine.  Follow pressures.  Follow metabolic panel.   

## 2022-12-05 NOTE — Assessment & Plan Note (Signed)
Colonoscopy 2019.  Follow cbc. Due f/u colonoscopy.

## 2023-01-05 DIAGNOSIS — D2261 Melanocytic nevi of right upper limb, including shoulder: Secondary | ICD-10-CM | POA: Diagnosis not present

## 2023-01-05 DIAGNOSIS — D2262 Melanocytic nevi of left upper limb, including shoulder: Secondary | ICD-10-CM | POA: Diagnosis not present

## 2023-01-05 DIAGNOSIS — Z85828 Personal history of other malignant neoplasm of skin: Secondary | ICD-10-CM | POA: Diagnosis not present

## 2023-01-05 DIAGNOSIS — D225 Melanocytic nevi of trunk: Secondary | ICD-10-CM | POA: Diagnosis not present

## 2023-01-05 DIAGNOSIS — D2272 Melanocytic nevi of left lower limb, including hip: Secondary | ICD-10-CM | POA: Diagnosis not present

## 2023-01-05 DIAGNOSIS — L728 Other follicular cysts of the skin and subcutaneous tissue: Secondary | ICD-10-CM | POA: Diagnosis not present

## 2023-01-05 DIAGNOSIS — L720 Epidermal cyst: Secondary | ICD-10-CM | POA: Diagnosis not present

## 2023-01-10 ENCOUNTER — Other Ambulatory Visit (INDEPENDENT_AMBULATORY_CARE_PROVIDER_SITE_OTHER): Payer: Medicare HMO

## 2023-01-10 DIAGNOSIS — E7800 Pure hypercholesterolemia, unspecified: Secondary | ICD-10-CM | POA: Diagnosis not present

## 2023-01-10 DIAGNOSIS — D75839 Thrombocytosis, unspecified: Secondary | ICD-10-CM

## 2023-01-10 LAB — CBC WITH DIFFERENTIAL/PLATELET
Basophils Absolute: 0 10*3/uL (ref 0.0–0.1)
Basophils Relative: 0.7 % (ref 0.0–3.0)
Eosinophils Absolute: 0.2 10*3/uL (ref 0.0–0.7)
Eosinophils Relative: 3.2 % (ref 0.0–5.0)
HCT: 36 % (ref 36.0–46.0)
Hemoglobin: 12 g/dL (ref 12.0–15.0)
Lymphocytes Relative: 30.1 % (ref 12.0–46.0)
Lymphs Abs: 1.8 10*3/uL (ref 0.7–4.0)
MCHC: 33.4 g/dL (ref 30.0–36.0)
MCV: 96.4 fl (ref 78.0–100.0)
Monocytes Absolute: 0.7 10*3/uL (ref 0.1–1.0)
Monocytes Relative: 11.9 % (ref 3.0–12.0)
Neutro Abs: 3.3 10*3/uL (ref 1.4–7.7)
Neutrophils Relative %: 54.1 % (ref 43.0–77.0)
Platelets: 393 10*3/uL (ref 150.0–400.0)
RBC: 3.74 Mil/uL — ABNORMAL LOW (ref 3.87–5.11)
RDW: 12.6 % (ref 11.5–15.5)
WBC: 6 10*3/uL (ref 4.0–10.5)

## 2023-01-10 LAB — BASIC METABOLIC PANEL
BUN: 17 mg/dL (ref 6–23)
CO2: 27 mEq/L (ref 19–32)
Calcium: 9.1 mg/dL (ref 8.4–10.5)
Chloride: 103 mEq/L (ref 96–112)
Creatinine, Ser: 0.68 mg/dL (ref 0.40–1.20)
GFR: 88.22 mL/min (ref >60.00)
Glucose, Bld: 76 mg/dL (ref 70–99)
Potassium: 4.3 mEq/L (ref 3.5–5.1)
Sodium: 140 mEq/L (ref 135–145)

## 2023-01-10 LAB — LIPID PANEL
Cholesterol: 218 mg/dL — ABNORMAL HIGH (ref 0–200)
HDL: 98.2 mg/dL (ref >39.00)
LDL Cholesterol: 107 mg/dL — ABNORMAL HIGH (ref 0–99)
NonHDL: 120.11
Total CHOL/HDL Ratio: 2
Triglycerides: 66 mg/dL (ref 0.0–149.0)
VLDL: 13.2 mg/dL (ref 0.0–40.0)

## 2023-01-10 LAB — HEPATIC FUNCTION PANEL
ALT: 14 U/L (ref 0–35)
AST: 23 U/L (ref 0–37)
Albumin: 4.2 g/dL (ref 3.5–5.2)
Alkaline Phosphatase: 37 U/L — ABNORMAL LOW (ref 39–117)
Bilirubin, Direct: 0.1 mg/dL (ref 0.0–0.3)
Total Bilirubin: 0.5 mg/dL (ref 0.2–1.2)
Total Protein: 7.1 g/dL (ref 6.0–8.3)

## 2023-02-12 ENCOUNTER — Other Ambulatory Visit: Payer: Self-pay | Admitting: Internal Medicine

## 2023-03-31 ENCOUNTER — Ambulatory Visit (INDEPENDENT_AMBULATORY_CARE_PROVIDER_SITE_OTHER): Payer: Medicare HMO | Admitting: Internal Medicine

## 2023-03-31 VITALS — BP 124/70 | HR 64 | Temp 97.8°F | Resp 16 | Ht 65.0 in | Wt 130.0 lb

## 2023-03-31 DIAGNOSIS — D75839 Thrombocytosis, unspecified: Secondary | ICD-10-CM

## 2023-03-31 DIAGNOSIS — R87619 Unspecified abnormal cytological findings in specimens from cervix uteri: Secondary | ICD-10-CM

## 2023-03-31 DIAGNOSIS — I10 Essential (primary) hypertension: Secondary | ICD-10-CM

## 2023-03-31 DIAGNOSIS — E7800 Pure hypercholesterolemia, unspecified: Secondary | ICD-10-CM | POA: Diagnosis not present

## 2023-03-31 DIAGNOSIS — Z800 Family history of malignant neoplasm of digestive organs: Secondary | ICD-10-CM

## 2023-03-31 DIAGNOSIS — Z85820 Personal history of malignant melanoma of skin: Secondary | ICD-10-CM

## 2023-03-31 DIAGNOSIS — Z1231 Encounter for screening mammogram for malignant neoplasm of breast: Secondary | ICD-10-CM

## 2023-03-31 DIAGNOSIS — D649 Anemia, unspecified: Secondary | ICD-10-CM | POA: Diagnosis not present

## 2023-03-31 DIAGNOSIS — F439 Reaction to severe stress, unspecified: Secondary | ICD-10-CM | POA: Diagnosis not present
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
## 2023-04-03 ENCOUNTER — Telehealth: Payer: Self-pay | Admitting: Internal Medicine

## 2023-04-03 ENCOUNTER — Encounter: Payer: Self-pay | Admitting: Internal Medicine

## 2023-04-03 DIAGNOSIS — Z1239 Encounter for other screening for malignant neoplasm of breast: Secondary | ICD-10-CM | POA: Insufficient documentation

## 2023-04-03 NOTE — Assessment & Plan Note (Signed)
Continue amlodipine.  Follow pressures.  Follow metabolic panel.   

## 2023-04-03 NOTE — Assessment & Plan Note (Signed)
Overdue a mammogram.  Discussed.  She wants to hold on scheduling.  Follow.

## 2023-04-03 NOTE — Assessment & Plan Note (Signed)
Follow cbc.  

## 2023-04-03 NOTE — Assessment & Plan Note (Signed)
Colonoscopy 2019. Due f/u colonoscopy. Need to confirm f/u.

## 2023-04-03 NOTE — Assessment & Plan Note (Signed)
Followed by dermatology

## 2023-04-03 NOTE — Telephone Encounter (Signed)
Please call and confirm if she has had recent colonoscopy.  Last we have 2019. Due this year.  If she has not had colonoscopy, then is she ok for referral.
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
## 2023-04-03 NOTE — Assessment & Plan Note (Signed)
Low cholesterol diet and exercise.  Follow lipid panel.   

## 2023-04-03 NOTE — Assessment & Plan Note (Signed)
Increased stress as outlined.  Discussed. Does not feel needs any further intervention at this time.  Will notify me if changes her mind.  

## 2023-04-04 NOTE — Telephone Encounter (Signed)
She is scheduled in August with Dr Servando Snare
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
## 2023-04-13 ENCOUNTER — Ambulatory Visit
Admission: RE | Admit: 2023-04-13 | Discharge: 2023-04-13 | Disposition: A | Discharge status: Home / Self Care | Payer: Medicare HMO | Source: Ambulatory Visit | Attending: Internal Medicine | Admitting: Internal Medicine

## 2023-04-13 ENCOUNTER — Encounter: Payer: Self-pay | Admitting: Gastroenterology

## 2023-04-13 DIAGNOSIS — Z1231 Encounter for screening mammogram for malignant neoplasm of breast: Secondary | ICD-10-CM | POA: Insufficient documentation

## 2023-04-21 ENCOUNTER — Ambulatory Visit (INDEPENDENT_AMBULATORY_CARE_PROVIDER_SITE_OTHER): Payer: Medicare HMO | Admitting: Emergency Medicine

## 2023-04-21 VITALS — Ht 65.0 in | Wt 127.0 lb

## 2023-04-21 DIAGNOSIS — Z0000 Encounter for general adult medical examination without abnormal findings: Secondary | ICD-10-CM

## 2023-04-21 NOTE — Patient Instructions (Addendum)
Julie Porter , Thank you for taking time to come for your Medicare Wellness Visit. I appreciate your ongoing commitment to your health goals. Please review the following plan we discussed and let me know if I can assist you in the future.   Referrals/Orders/Follow-Ups/Clinician Recommendations: Get your flu shot in the fall. Recommend a bone density test next year when you get your mammogram. Keep up the good work!  This is a list of the screening recommended for you and due dates:  Health Maintenance  Topic Date Due   Flu Shot  04/14/2023   Colon Cancer Screening  05/22/2023*   DEXA scan (bone density measurement)  04/20/2024*   DTaP/Tdap/Td vaccine (2 - Tdap) 01/26/2024   Mammogram  04/12/2024   Medicare Annual Wellness Visit  04/20/2024   Pneumonia Vaccine  Completed   Hepatitis C Screening  Completed   HPV Vaccine  Aged Out   COVID-19 Vaccine  Discontinued   Zoster (Shingles) Vaccine  Discontinued  *Topic was postponed. The date shown is not the original due date.    Advanced directives: (Declined) Advance directive discussed with you today. Even though you declined this today, please call our office should you change your mind, and we can give you the proper paperwork for you to fill out.  Next Medicare Annual Wellness Visit scheduled for next year: Yes, 04/26/24 @ 12:45pm  Preventive Care 65 Years and Older, Female Preventive care refers to lifestyle choices and visits with your health care provider that can promote health and wellness. What does preventive care include? A yearly physical exam. This is also called an annual well check. Dental exams once or twice a year. Routine eye exams. Ask your health care provider how often you should have your eyes checked. Personal lifestyle choices, including: Daily care of your teeth and gums. Regular physical activity. Eating a healthy diet. Avoiding tobacco and drug use. Limiting alcohol use. Practicing safe sex. Taking low-dose  aspirin every day. Taking vitamin and mineral supplements as recommended by your health care provider. What happens during an annual well check? The services and screenings done by your health care provider during your annual well check will depend on your age, overall health, lifestyle risk factors, and family history of disease. Counseling  Your health care provider may ask you questions about your: Alcohol use. Tobacco use. Drug use. Emotional well-being. Home and relationship well-being. Sexual activity. Eating habits. History of falls. Memory and ability to understand (cognition). Work and work Astronomer. Reproductive health. Screening  You may have the following tests or measurements: Height, weight, and BMI. Blood pressure. Lipid and cholesterol levels. These may be checked every 5 years, or more frequently if you are over 33 years old. Skin check. Lung cancer screening. You may have this screening every year starting at age 19 if you have a 30-pack-year history of smoking and currently smoke or have quit within the past 15 years. Fecal occult blood test (FOBT) of the stool. You may have this test every year starting at age 35. Flexible sigmoidoscopy or colonoscopy. You may have a sigmoidoscopy every 5 years or a colonoscopy every 10 years starting at age 96. Hepatitis C blood test. Hepatitis B blood test. Sexually transmitted disease (STD) testing. Diabetes screening. This is done by checking your blood sugar (glucose) after you have not eaten for a while (fasting). You may have this done every 1-3 years. Bone density scan. This is done to screen for osteoporosis. You may have this done starting at  age 33. Mammogram. This may be done every 1-2 years. Talk to your health care provider about how often you should have regular mammograms. Talk with your health care provider about your test results, treatment options, and if necessary, the need for more tests. Vaccines  Your  health care provider may recommend certain vaccines, such as: Influenza vaccine. This is recommended every year. Tetanus, diphtheria, and acellular pertussis (Tdap, Td) vaccine. You may need a Td booster every 10 years. Zoster vaccine. You may need this after age 58. Pneumococcal 13-valent conjugate (PCV13) vaccine. One dose is recommended after age 53. Pneumococcal polysaccharide (PPSV23) vaccine. One dose is recommended after age 76. Talk to your health care provider about which screenings and vaccines you need and how often you need them. This information is not intended to replace advice given to you by your health care provider. Make sure you discuss any questions you have with your health care provider. Document Released: 09/26/2015 Document Revised: 05/19/2016 Document Reviewed: 07/01/2015 Elsevier Interactive Patient Education  2017 [employer].  Fall Prevention in the Home Falls can cause injuries. They can happen to people of all ages. There are many things you can do to make your home safe and to help prevent falls. What can I do on the outside of my home? Regularly fix the edges of walkways and driveways and fix any cracks. Remove anything that might make you trip as you walk through a door, such as a raised step or threshold. Trim any bushes or trees on the path to your home. Use bright outdoor lighting. Clear any walking paths of anything that might make someone trip, such as rocks or tools. Regularly check to see if handrails are loose or broken. Make sure that both sides of any steps have handrails. Any raised decks and porches should have guardrails on the edges. Have any leaves, snow, or ice cleared regularly. Use sand or salt on walking paths during winter. Clean up any spills in your garage right away. This includes oil or grease spills. What can I do in the bathroom? Use night lights. Install grab bars by the toilet and in the tub and shower. Do not use towel bars as  grab bars. Use non-skid mats or decals in the tub or shower. If you need to sit down in the shower, use a plastic, non-slip stool. Keep the floor dry. Clean up any water that spills on the floor as soon as it happens. Remove soap buildup in the tub or shower regularly. Attach bath mats securely with double-sided non-slip rug tape. Do not have throw rugs and other things on the floor that can make you trip. What can I do in the bedroom? Use night lights. Make sure that you have a light by your bed that is easy to reach. Do not use any sheets or blankets that are too big for your bed. They should not hang down onto the floor. Have a firm chair that has side arms. You can use this for support while you get dressed. Do not have throw rugs and other things on the floor that can make you trip. What can I do in the kitchen? Clean up any spills right away. Avoid walking on wet floors. Keep items that you use a lot in easy-to-reach places. If you need to reach something above you, use a strong step stool that has a grab bar. Keep electrical cords out of the way. Do not use floor polish or wax that makes floors  slippery. If you must use wax, use non-skid floor wax. Do not have throw rugs and other things on the floor that can make you trip. What can I do with my stairs? Do not leave any items on the stairs. Make sure that there are handrails on both sides of the stairs and use them. Fix handrails that are broken or loose. Make sure that handrails are as long as the stairways. Check any carpeting to make sure that it is firmly attached to the stairs. Fix any carpet that is loose or worn. Avoid having throw rugs at the top or bottom of the stairs. If you do have throw rugs, attach them to the floor with carpet tape. Make sure that you have a light switch at the top of the stairs and the bottom of the stairs. If you do not have them, ask someone to add them for you. What else can I do to help prevent  falls? Wear shoes that: Do not have high heels. Have rubber bottoms. Are comfortable and fit you well. Are closed at the toe. Do not wear sandals. If you use a stepladder: Make sure that it is fully opened. Do not climb a closed stepladder. Make sure that both sides of the stepladder are locked into place. Ask someone to hold it for you, if possible. Clearly mark and make sure that you can see: Any grab bars or handrails. First and last steps. Where the edge of each step is. Use tools that help you move around (mobility aids) if they are needed. These include: Canes. Walkers. Scooters. Crutches. Turn on the lights when you go into a dark area. Replace any light bulbs as soon as they burn out. Set up your furniture so you have a clear path. Avoid moving your furniture around. If any of your floors are uneven, fix them. If there are any pets around you, be aware of where they are. Review your medicines with your doctor. Some medicines can make you feel dizzy. This can increase your chance of falling. Ask your doctor what other things that you can do to help prevent falls. This information is not intended to replace advice given to you by your health care provider. Make sure you discuss any questions you have with your health care provider. Document Released: 06/26/2009 Document Revised: 02/05/2016 Document Reviewed: 10/04/2014 Elsevier Interactive Patient Education  2017 [employer].
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
## 2023-04-25 ENCOUNTER — Encounter: Payer: Self-pay | Admitting: Gastroenterology

## 2023-04-25 ENCOUNTER — Ambulatory Visit
Admission: RE | Admit: 2023-04-25 | Discharge: 2023-04-25 | Disposition: A | Discharge status: Home / Self Care | Payer: Medicare HMO | Attending: Gastroenterology | Admitting: Gastroenterology

## 2023-04-25 ENCOUNTER — Ambulatory Visit: Payer: Medicare HMO | Admitting: Anesthesiology

## 2023-04-25 ENCOUNTER — Encounter
Admission: RE | Disposition: A | Discharge status: Home / Self Care | Payer: Self-pay | Source: Home / Self Care | Attending: Gastroenterology

## 2023-04-25 ENCOUNTER — Other Ambulatory Visit: Payer: Self-pay

## 2023-04-25 DIAGNOSIS — Q438 Other specified congenital malformations of intestine: Secondary | ICD-10-CM | POA: Diagnosis not present

## 2023-04-25 DIAGNOSIS — K640 First degree hemorrhoids: Secondary | ICD-10-CM | POA: Insufficient documentation

## 2023-04-25 DIAGNOSIS — I10 Essential (primary) hypertension: Secondary | ICD-10-CM | POA: Diagnosis not present

## 2023-04-25 DIAGNOSIS — Z800 Family history of malignant neoplasm of digestive organs: Secondary | ICD-10-CM | POA: Diagnosis not present

## 2023-04-25 DIAGNOSIS — Z86010 Personal history of colonic polyps: Secondary | ICD-10-CM

## 2023-04-25 DIAGNOSIS — Z1211 Encounter for screening for malignant neoplasm of colon: Secondary | ICD-10-CM | POA: Diagnosis not present

## 2023-04-25 DIAGNOSIS — Z09 Encounter for follow-up examination after completed treatment for conditions other than malignant neoplasm: Secondary | ICD-10-CM | POA: Diagnosis not present

## 2023-04-25 HISTORY — PX: COLONOSCOPY WITH PROPOFOL: SHX5780

## 2023-04-25 SURGERY — COLONOSCOPY WITH PROPOFOL
Anesthesia: General

## 2023-04-25 MED ORDER — LIDOCAINE HCL (CARDIAC) PF 100 MG/5ML IV SOSY
PREFILLED_SYRINGE | INTRAVENOUS | Status: DC | PRN
  Administered 2023-04-25: 80 mg via INTRAVENOUS

## 2023-04-25 MED ORDER — LACTATED RINGERS IV SOLN: INTRAVENOUS | Status: DC

## 2023-04-25 MED ORDER — SODIUM CHLORIDE 0.9 % IV SOLN: INTRAVENOUS | Status: DC

## 2023-04-25 MED ORDER — PROPOFOL 10 MG/ML IV BOLUS
INTRAVENOUS | Status: DC | PRN
Start: 2023-04-25 — End: 2023-04-25
  Administered 2023-04-25: 10 mg via INTRAVENOUS
  Administered 2023-04-25: 30 mg via INTRAVENOUS
  Administered 2023-04-25: 20 mg via INTRAVENOUS
  Administered 2023-04-25: 30 mg via INTRAVENOUS
  Administered 2023-04-25 (×3): 20 mg via INTRAVENOUS
  Administered 2023-04-25: 30 mg via INTRAVENOUS
  Administered 2023-04-25: 20 mg via INTRAVENOUS
  Administered 2023-04-25: 10 mg via INTRAVENOUS
  Administered 2023-04-25: 20 mg via INTRAVENOUS
  Administered 2023-04-25: 10 mg via INTRAVENOUS
  Administered 2023-04-25: 30 mg via INTRAVENOUS
  Administered 2023-04-25: 20 mg via INTRAVENOUS
  Administered 2023-04-25: 50 mg via INTRAVENOUS

## 2023-04-25 MED ORDER — STERILE WATER FOR IRRIGATION IR SOLN
Status: DC | PRN
  Administered 2023-04-25: 1

## 2023-04-25 SURGICAL SUPPLY — 21 items

## 2023-04-25 NOTE — Transfer of Care (Signed)
Immediate Anesthesia Transfer of Care Note  Patient: Julie Porter  Procedure(s) Performed: COLONOSCOPY WITH PROPOFOL  Patient Location: PACU  Anesthesia Type: General  Level of Consciousness: awake, alert  and patient cooperative  Airway and Oxygen Therapy: Patient Spontanous Breathing and Patient connected to supplemental oxygen  Post-op Assessment: Post-op Vital signs reviewed, Patient's Cardiovascular Status Stable, Respiratory Function Stable, Patent Airway and No signs of Nausea or vomiting  Post-op Vital Signs: Reviewed and stable  Complications: No notable events documented.

## 2023-04-25 NOTE — Op Note (Signed)
[facility] Gastroenterology Patient Name: Julie Porter Procedure Date: 04/25/2023 10:19 AM MRN: [id number] Account #: [id number] Date of Birth: [birth year] Admit Type: Outpatient Age: 72 Room: [facility] OR ROOM 01 Gender: Female Note Status: Finalized Instrument Name: [id number] Procedure:             Colonoscopy Indications:           Screening in patient at increased risk: Family history                         of 1st-degree relative with colorectal cancer Providers:             Midge Minium MD, MD Referring MD:          Dale [place], MD (Referring MD) Medicines:             Propofol per Anesthesia Complications:         No immediate complications. Procedure:             Pre-Anesthesia Assessment:                        - Prior to the procedure, a History and Physical was                         performed, and patient medications and allergies were                         reviewed. The patient's tolerance of previous                         anesthesia was also reviewed. The risks and benefits                         of the procedure and the sedation options and risks                         were discussed with the patient. All questions were                         answered, and informed consent was obtained. Prior                         Anticoagulants: The patient has taken no anticoagulant                         or antiplatelet agents. ASA Grade Assessment: II - A                         patient with mild systemic disease. After reviewing                         the risks and benefits, the patient was deemed in                         satisfactory condition to undergo the procedure.                        After obtaining informed consent, the colonoscope was  passed under direct vision. Throughout the procedure,                         the patient's blood pressure, pulse, and oxygen                         saturations were monitored  continuously. The                         Colonoscope was introduced through the anus and                         advanced to the the cecum, identified by appendiceal                         orifice and ileocecal valve. The patient tolerated the                         procedure well. The quality of the bowel preparation                         was excellent. The colonoscopy was unusually difficult                         due to a tortuous colon. Findings:      The perianal and digital rectal examinations were normal.      Non-bleeding internal hemorrhoids were found during retroflexion. The       hemorrhoids were Grade I (internal hemorrhoids that do not prolapse). Impression:            - Non-bleeding internal hemorrhoids.                        - No specimens collected. Recommendation:        - Discharge patient to home.                        - Resume previous diet.                        - Continue present medications.                        - Repeat colonoscopy in 5 years for surveillance. Procedure Code(s):     --- Professional ---                        [phone], Colonoscopy, flexible; diagnostic, including                         collection of specimen(s) by brushing or washing, when                         performed (separate procedure) Diagnosis Code(s):     --- Professional ---                        Z80.0, Family history of malignant neoplasm of                         digestive organs  CPT copyright 2022 American Medical Association. All rights reserved. The codes documented in this report are preliminary and upon coder review may  be revised to meet current compliance requirements. Midge Minium MD, MD 04/25/2023 10:57:02 AM This report has been signed electronically. Number of Addenda: 0 Note Initiated On: 04/25/2023 10:19 AM Scope Withdrawal Time: 0 hours 6 minutes 18 seconds  Total Procedure Duration: 0 hours 24 minutes 8 seconds  Estimated Blood Loss:  Estimated blood  loss: none.      [facility]
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
## 2023-04-25 NOTE — Anesthesia Postprocedure Evaluation (Signed)
Anesthesia Post Note  Patient: Julie Porter  Procedure(s) Performed: COLONOSCOPY WITH PROPOFOL  Patient location during evaluation: PACU Anesthesia Type: General Level of consciousness: awake and alert Pain management: pain level controlled Vital Signs Assessment: post-procedure vital signs reviewed and stable Respiratory status: spontaneous breathing, nonlabored ventilation, respiratory function stable and patient connected to nasal cannula oxygen Cardiovascular status: blood pressure returned to baseline and stable Postop Assessment: no apparent nausea or vomiting Anesthetic complications: no   No notable events documented.   Last Vitals:  Vitals:   04/25/23 1100 04/25/23 1110  BP: (!) 102/51 109/63  Pulse: 64 66  Resp: 20 18  Temp:  (!) 36.2 C  SpO2: 95% 99%    Last Pain:  Vitals:   04/25/23 1110  TempSrc:   PainSc: 0-No pain                  C 

## 2023-04-26 ENCOUNTER — Encounter: Payer: Self-pay | Admitting: Gastroenterology

## 2023-04-28 ENCOUNTER — Other Ambulatory Visit (INDEPENDENT_AMBULATORY_CARE_PROVIDER_SITE_OTHER): Payer: Medicare HMO

## 2023-04-28 DIAGNOSIS — E7800 Pure hypercholesterolemia, unspecified: Secondary | ICD-10-CM

## 2023-04-28 DIAGNOSIS — D649 Anemia, unspecified: Secondary | ICD-10-CM | POA: Diagnosis not present

## 2023-04-28 LAB — CBC WITH DIFFERENTIAL/PLATELET
Basophils Absolute: 0 10*3/uL (ref 0.0–0.1)
Basophils Relative: 0.7 % (ref 0.0–3.0)
Eosinophils Absolute: 0.4 10*3/uL (ref 0.0–0.7)
Eosinophils Relative: 6.2 % — ABNORMAL HIGH (ref 0.0–5.0)
HCT: 36.1 % (ref 36.0–46.0)
Hemoglobin: 12.1 g/dL (ref 12.0–15.0)
Lymphocytes Relative: 28.5 % (ref 12.0–46.0)
Lymphs Abs: 1.6 10*3/uL (ref 0.7–4.0)
MCHC: 33.5 g/dL (ref 30.0–36.0)
MCV: 96 fl (ref 78.0–100.0)
Monocytes Absolute: 0.7 10*3/uL (ref 0.1–1.0)
Monocytes Relative: 12.6 % — ABNORMAL HIGH (ref 3.0–12.0)
Neutro Abs: 3 10*3/uL (ref 1.4–7.7)
Neutrophils Relative %: 52 % (ref 43.0–77.0)
Platelets: 437 10*3/uL — ABNORMAL HIGH (ref 150.0–400.0)
RBC: 3.76 Mil/uL — ABNORMAL LOW (ref 3.87–5.11)
RDW: 12.7 % (ref 11.5–15.5)
WBC: 5.8 10*3/uL (ref 4.0–10.5)

## 2023-04-28 LAB — HEPATIC FUNCTION PANEL
ALT: 12 U/L (ref 0–35)
AST: 19 U/L (ref 0–37)
Albumin: 4.3 g/dL (ref 3.5–5.2)
Alkaline Phosphatase: 36 U/L — ABNORMAL LOW (ref 39–117)
Bilirubin, Direct: 0.1 mg/dL (ref 0.0–0.3)
Total Bilirubin: 0.5 mg/dL (ref 0.2–1.2)
Total Protein: 7.1 g/dL (ref 6.0–8.3)

## 2023-04-28 LAB — BASIC METABOLIC PANEL
BUN: 14 mg/dL (ref 6–23)
CO2: 28 mEq/L (ref 19–32)
Calcium: 9.3 mg/dL (ref 8.4–10.5)
Chloride: 100 mEq/L (ref 96–112)
Creatinine, Ser: 0.63 mg/dL (ref 0.40–1.20)
GFR: 89.67 mL/min (ref >60.00)
Glucose, Bld: 79 mg/dL (ref 70–99)
Potassium: 4.3 mEq/L (ref 3.5–5.1)
Sodium: 135 mEq/L (ref 135–145)

## 2023-04-28 LAB — LIPID PANEL
Cholesterol: 211 mg/dL — ABNORMAL HIGH (ref 0–200)
HDL: 85.7 mg/dL (ref >39.00)
LDL Cholesterol: 112 mg/dL — ABNORMAL HIGH (ref 0–99)
NonHDL: 125.12
Total CHOL/HDL Ratio: 2
Triglycerides: 64 mg/dL (ref 0.0–149.0)
VLDL: 12.8 mg/dL (ref 0.0–40.0)

## 2023-04-28 LAB — TSH: TSH: 2.21 u[IU]/mL (ref 0.35–5.50)

## 2023-05-02 ENCOUNTER — Other Ambulatory Visit: Payer: Self-pay

## 2023-05-02 DIAGNOSIS — D75839 Thrombocytosis, unspecified: Secondary | ICD-10-CM

## 2023-05-09 ENCOUNTER — Other Ambulatory Visit: Payer: Self-pay

## 2023-05-09 DIAGNOSIS — E7800 Pure hypercholesterolemia, unspecified: Secondary | ICD-10-CM

## 2023-05-09 MED ORDER — ROSUVASTATIN CALCIUM 10 MG PO TABS: 10.0000 mg | ORAL_TABLET | Freq: Every day | ORAL | 0 refills | Status: DC

## 2023-05-09 NOTE — Telephone Encounter (Signed)
Ok for her to start with crestor 5 mg and cancel the 10 mg

## 2023-05-09 NOTE — Telephone Encounter (Signed)
Ok to start 5mg  crestor.

## 2023-05-10 MED ORDER — ROSUVASTATIN CALCIUM 5 MG PO TABS: 5.0000 mg | ORAL_TABLET | Freq: Every day | ORAL | 0 refills | Status: DC

## 2023-05-10 NOTE — Addendum Note (Signed)
Addended by: Rita Ohara D on: 05/10/2023 10:11 AM   Modules accepted: Orders

## 2023-05-17 ENCOUNTER — Other Ambulatory Visit: Payer: Self-pay

## 2023-05-17 MED ORDER — AMLODIPINE BESYLATE 2.5 MG PO TABS: 2.5000 mg | ORAL_TABLET | Freq: Every day | ORAL | 1 refills | Status: DC

## 2023-06-08 ENCOUNTER — Other Ambulatory Visit: Payer: Self-pay | Admitting: Internal Medicine

## 2023-06-10 ENCOUNTER — Other Ambulatory Visit: Payer: Self-pay | Admitting: Internal Medicine

## 2023-06-21 ENCOUNTER — Encounter: Payer: Self-pay | Admitting: Internal Medicine

## 2023-06-28 ENCOUNTER — Other Ambulatory Visit (INDEPENDENT_AMBULATORY_CARE_PROVIDER_SITE_OTHER): Payer: Medicare HMO

## 2023-06-28 DIAGNOSIS — E7800 Pure hypercholesterolemia, unspecified: Secondary | ICD-10-CM

## 2023-06-28 DIAGNOSIS — D75839 Thrombocytosis, unspecified: Secondary | ICD-10-CM

## 2023-06-28 LAB — CBC WITH DIFFERENTIAL/PLATELET
Basophils Absolute: 0 10*3/uL (ref 0.0–0.1)
Basophils Relative: 0.6 % (ref 0.0–3.0)
Eosinophils Absolute: 0.2 10*3/uL (ref 0.0–0.7)
Eosinophils Relative: 2.5 % (ref 0.0–5.0)
HCT: 34.4 % — ABNORMAL LOW (ref 36.0–46.0)
Hemoglobin: 11.5 g/dL — ABNORMAL LOW (ref 12.0–15.0)
Lymphocytes Relative: 28.4 % (ref 12.0–46.0)
Lymphs Abs: 1.9 10*3/uL (ref 0.7–4.0)
MCHC: 33.4 g/dL (ref 30.0–36.0)
MCV: 97.2 fL (ref 78.0–100.0)
Monocytes Absolute: 0.8 10*3/uL (ref 0.1–1.0)
Monocytes Relative: 11.9 % (ref 3.0–12.0)
Neutro Abs: 3.8 10*3/uL (ref 1.4–7.7)
Neutrophils Relative %: 56.6 % (ref 43.0–77.0)
Platelets: 363 10*3/uL (ref 150.0–400.0)
RBC: 3.54 Mil/uL — ABNORMAL LOW (ref 3.87–5.11)
RDW: 12.5 % (ref 11.5–15.5)
WBC: 6.7 10*3/uL (ref 4.0–10.5)

## 2023-06-28 LAB — HEPATIC FUNCTION PANEL
ALT: 16 U/L (ref 0–35)
AST: 23 U/L (ref 0–37)
Albumin: 4.2 g/dL (ref 3.5–5.2)
Alkaline Phosphatase: 38 U/L — ABNORMAL LOW (ref 39–117)
Bilirubin, Direct: 0.1 mg/dL (ref 0.0–0.3)
Total Bilirubin: 0.4 mg/dL (ref 0.2–1.2)
Total Protein: 7.3 g/dL (ref 6.0–8.3)

## 2023-06-30 ENCOUNTER — Ambulatory Visit: Payer: Medicare HMO

## 2023-06-30 ENCOUNTER — Other Ambulatory Visit: Payer: Self-pay | Admitting: Internal Medicine

## 2023-06-30 ENCOUNTER — Other Ambulatory Visit (INDEPENDENT_AMBULATORY_CARE_PROVIDER_SITE_OTHER): Payer: Medicare HMO

## 2023-06-30 DIAGNOSIS — D649 Anemia, unspecified: Secondary | ICD-10-CM

## 2023-06-30 LAB — VITAMIN B12: Vitamin B-12: 236 pg/mL (ref 211–911)

## 2023-06-30 LAB — IBC + FERRITIN
Ferritin: 82.4 ng/mL (ref 10.0–291.0)
Iron: 89 ug/dL (ref 42–145)
Saturation Ratios: 24.5 % (ref 20.0–50.0)
TIBC: 362.6 ug/dL (ref 250.0–450.0)
Transferrin: 259 mg/dL (ref 212.0–360.0)

## 2023-06-30 NOTE — Telephone Encounter (Signed)
Let her know that when I saw her labs, I added iron studies and B12 level - to further evaluate.  See lab result note.

## 2023-06-30 NOTE — Progress Notes (Signed)
Order placed for add on labs.

## 2023-07-01 NOTE — Telephone Encounter (Signed)
See result note.  

## 2023-07-08 ENCOUNTER — Other Ambulatory Visit: Payer: Self-pay | Admitting: Internal Medicine

## 2023-07-11 ENCOUNTER — Other Ambulatory Visit: Payer: Self-pay | Admitting: Internal Medicine

## 2023-07-12 MED ORDER — ROSUVASTATIN CALCIUM 5 MG PO TABS: 5.0000 mg | ORAL_TABLET | Freq: Every day | ORAL | 0 refills | Status: DC

## 2023-08-01 ENCOUNTER — Ambulatory Visit: Payer: Medicare HMO | Admitting: Internal Medicine

## 2023-08-01 DIAGNOSIS — D649 Anemia, unspecified: Secondary | ICD-10-CM

## 2023-08-01 DIAGNOSIS — E7800 Pure hypercholesterolemia, unspecified: Secondary | ICD-10-CM

## 2023-08-05 ENCOUNTER — Ambulatory Visit (INDEPENDENT_AMBULATORY_CARE_PROVIDER_SITE_OTHER): Payer: Medicare HMO | Admitting: Internal Medicine

## 2023-08-05 ENCOUNTER — Encounter: Payer: Self-pay | Admitting: Internal Medicine

## 2023-08-05 VITALS — BP 126/70 | HR 66 | Temp 98.2°F | Resp 16 | Ht 65.0 in | Wt 138.8 lb

## 2023-08-05 DIAGNOSIS — Z85820 Personal history of malignant melanoma of skin: Secondary | ICD-10-CM

## 2023-08-05 DIAGNOSIS — D225 Melanocytic nevi of trunk: Secondary | ICD-10-CM | POA: Diagnosis not present

## 2023-08-05 DIAGNOSIS — E7800 Pure hypercholesterolemia, unspecified: Secondary | ICD-10-CM | POA: Diagnosis not present

## 2023-08-05 DIAGNOSIS — Z872 Personal history of diseases of the skin and subcutaneous tissue: Secondary | ICD-10-CM | POA: Diagnosis not present

## 2023-08-05 DIAGNOSIS — L942 Calcinosis cutis: Secondary | ICD-10-CM | POA: Diagnosis not present

## 2023-08-05 DIAGNOSIS — D649 Anemia, unspecified: Secondary | ICD-10-CM | POA: Diagnosis not present

## 2023-08-05 DIAGNOSIS — D485 Neoplasm of uncertain behavior of skin: Secondary | ICD-10-CM | POA: Diagnosis not present

## 2023-08-05 DIAGNOSIS — D2272 Melanocytic nevi of left lower limb, including hip: Secondary | ICD-10-CM | POA: Diagnosis not present

## 2023-08-05 DIAGNOSIS — Z85828 Personal history of other malignant neoplasm of skin: Secondary | ICD-10-CM | POA: Diagnosis not present

## 2023-08-05 DIAGNOSIS — I10 Essential (primary) hypertension: Secondary | ICD-10-CM

## 2023-08-05 DIAGNOSIS — L720 Epidermal cyst: Secondary | ICD-10-CM | POA: Diagnosis not present

## 2023-08-05 DIAGNOSIS — D2271 Melanocytic nevi of right lower limb, including hip: Secondary | ICD-10-CM | POA: Diagnosis not present

## 2023-08-05 DIAGNOSIS — F439 Reaction to severe stress, unspecified: Secondary | ICD-10-CM

## 2023-08-05 DIAGNOSIS — D2262 Melanocytic nevi of left upper limb, including shoulder: Secondary | ICD-10-CM | POA: Diagnosis not present

## 2023-08-05 DIAGNOSIS — D2261 Melanocytic nevi of right upper limb, including shoulder: Secondary | ICD-10-CM | POA: Diagnosis not present

## 2023-08-05 NOTE — Assessment & Plan Note (Signed)
Low cholesterol diet and exercise.  Follow lipid panel.   
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
## 2023-08-05 NOTE — Assessment & Plan Note (Signed)
Colonoscopy 2019.  Follow cbc. F/u colonoscopy 04/2023 - internal hemorrhoids. Recommended f/u in 5 years.

## 2023-08-05 NOTE — Assessment & Plan Note (Signed)
Increased stress as outlined.  Discussed. Does not feel needs any further intervention at this time.  Reports doing relatively well.  Follow.

## 2023-08-05 NOTE — Assessment & Plan Note (Signed)
Continue amlodipine.  Follow pressures.  Follow metabolic panel.

## 2023-08-05 NOTE — Assessment & Plan Note (Signed)
Followed by dermatology.  Evaluated today.

## 2023-08-06 LAB — CBC WITH DIFFERENTIAL/PLATELET
Absolute Lymphocytes: 2820 {cells}/uL (ref 850–3900)
Absolute Monocytes: 810 {cells}/uL (ref 200–950)
Basophils Absolute: 23 {cells}/uL (ref 0–200)
Basophils Relative: 0.3 %
Eosinophils Absolute: 98 {cells}/uL (ref 15–500)
Eosinophils Relative: 1.3 %
HCT: 35.8 % (ref 35.0–45.0)
Hemoglobin: 11.4 g/dL — ABNORMAL LOW (ref 11.7–15.5)
MCH: 30.9 pg (ref 27.0–33.0)
MCHC: 31.8 g/dL — ABNORMAL LOW (ref 32.0–36.0)
MCV: 97 fL (ref 80.0–100.0)
MPV: 9.3 fL (ref 7.5–12.5)
Monocytes Relative: 10.8 %
Neutro Abs: 3750 {cells}/uL (ref 1500–7800)
Neutrophils Relative %: 50 %
Platelets: 394 10*3/uL (ref 140–400)
RBC: 3.69 10*6/uL — ABNORMAL LOW (ref 3.80–5.10)
RDW: 11.5 % (ref 11.0–15.0)
Total Lymphocyte: 37.6 %
WBC: 7.5 10*3/uL (ref 3.8–10.8)

## 2023-08-06 LAB — BASIC METABOLIC PANEL WITH GFR
BUN/Creatinine Ratio: 25 (calc) — ABNORMAL HIGH (ref 6–22)
BUN: 15 mg/dL (ref 7–25)
CO2: 29 mmol/L (ref 20–32)
Calcium: 9.8 mg/dL (ref 8.6–10.4)
Chloride: 102 mmol/L (ref 98–110)
Creat: 0.59 mg/dL — ABNORMAL LOW (ref 0.60–1.00)
Glucose, Bld: 81 mg/dL (ref 65–99)
Potassium: 4.5 mmol/L (ref 3.5–5.3)
Sodium: 140 mmol/L (ref 135–146)
eGFR: 96 mL/min/{1.73_m2} (ref >=60)

## 2023-08-06 LAB — LIPID PANEL
Cholesterol: 193 mg/dL (ref <200)
HDL: 108 mg/dL (ref >=50)
LDL Cholesterol (Calc): 71 mg/dL
Non-HDL Cholesterol (Calc): 85 mg/dL (ref <130)
Total CHOL/HDL Ratio: 1.8 (calc) (ref <5.0)
Triglycerides: 60 mg/dL (ref <150)

## 2023-08-06 LAB — HEPATIC FUNCTION PANEL
AG Ratio: 1.6 (calc) (ref 1.0–2.5)
ALT: 22 U/L (ref 6–29)
AST: 25 U/L (ref 10–35)
Albumin: 4.6 g/dL (ref 3.6–5.1)
Alkaline phosphatase (APISO): 47 U/L (ref 37–153)
Bilirubin, Direct: 0.1 mg/dL (ref 0.0–0.2)
Globulin: 2.9 g/dL (ref 1.9–3.7)
Indirect Bilirubin: 0.3 mg/dL (ref 0.2–1.2)
Total Bilirubin: 0.4 mg/dL (ref 0.2–1.2)
Total Protein: 7.5 g/dL (ref 6.1–8.1)

## 2023-08-09 ENCOUNTER — Other Ambulatory Visit: Payer: Self-pay | Admitting: Internal Medicine

## 2023-08-10 ENCOUNTER — Other Ambulatory Visit: Payer: Self-pay

## 2023-08-10 DIAGNOSIS — D649 Anemia, unspecified: Secondary | ICD-10-CM

## 2023-08-10 MED ORDER — ROSUVASTATIN CALCIUM 5 MG PO TABS: 5.0000 mg | ORAL_TABLET | Freq: Every day | ORAL | 0 refills | Status: DC

## 2023-09-01 ENCOUNTER — Other Ambulatory Visit: Payer: Self-pay | Admitting: Internal Medicine

## 2023-09-05 MED ORDER — ROSUVASTATIN CALCIUM 5 MG PO TABS: 5.0000 mg | ORAL_TABLET | Freq: Every day | ORAL | 11 refills | Status: DC

## 2023-10-05 ENCOUNTER — Other Ambulatory Visit (INDEPENDENT_AMBULATORY_CARE_PROVIDER_SITE_OTHER): Payer: Medicare HMO

## 2023-10-05 DIAGNOSIS — D649 Anemia, unspecified: Secondary | ICD-10-CM

## 2023-10-05 LAB — CBC WITH DIFFERENTIAL/PLATELET
Basophils Absolute: 0 10*3/uL (ref 0.0–0.1)
Basophils Relative: 0.6 % (ref 0.0–3.0)
Eosinophils Absolute: 0.1 10*3/uL (ref 0.0–0.7)
Eosinophils Relative: 1.5 % (ref 0.0–5.0)
HCT: 36 % (ref 36.0–46.0)
Hemoglobin: 12.3 g/dL (ref 12.0–15.0)
Lymphocytes Relative: 36.2 % (ref 12.0–46.0)
Lymphs Abs: 2.2 10*3/uL (ref 0.7–4.0)
MCHC: 34.2 g/dL (ref 30.0–36.0)
MCV: 96.6 fL (ref 78.0–100.0)
Monocytes Absolute: 0.7 10*3/uL (ref 0.1–1.0)
Monocytes Relative: 12.4 % — ABNORMAL HIGH (ref 3.0–12.0)
Neutro Abs: 3 10*3/uL (ref 1.4–7.7)
Neutrophils Relative %: 49.3 % (ref 43.0–77.0)
Platelets: 432 10*3/uL — ABNORMAL HIGH (ref 150.0–400.0)
RBC: 3.72 Mil/uL — ABNORMAL LOW (ref 3.87–5.11)
RDW: 12.7 % (ref 11.5–15.5)
WBC: 6.1 10*3/uL (ref 4.0–10.5)

## 2023-10-05 LAB — VITAMIN B12: Vitamin B-12: 719 pg/mL (ref 211–911)

## 2023-11-08 ENCOUNTER — Other Ambulatory Visit: Payer: Self-pay | Admitting: Internal Medicine

## 2023-12-06 ENCOUNTER — Encounter: Payer: Self-pay | Admitting: Internal Medicine

## 2023-12-06 ENCOUNTER — Ambulatory Visit (INDEPENDENT_AMBULATORY_CARE_PROVIDER_SITE_OTHER): Payer: Medicare HMO | Admitting: Internal Medicine

## 2023-12-06 VITALS — BP 118/74 | HR 70 | Temp 98.0°F | Resp 16 | Ht 65.0 in | Wt 137.6 lb

## 2023-12-06 DIAGNOSIS — R87619 Unspecified abnormal cytological findings in specimens from cervix uteri: Secondary | ICD-10-CM

## 2023-12-06 DIAGNOSIS — F439 Reaction to severe stress, unspecified: Secondary | ICD-10-CM | POA: Diagnosis not present

## 2023-12-06 DIAGNOSIS — E7800 Pure hypercholesterolemia, unspecified: Secondary | ICD-10-CM

## 2023-12-06 DIAGNOSIS — I10 Essential (primary) hypertension: Secondary | ICD-10-CM | POA: Diagnosis not present

## 2023-12-06 DIAGNOSIS — Z0000 Encounter for general adult medical examination without abnormal findings: Secondary | ICD-10-CM | POA: Diagnosis not present

## 2023-12-06 DIAGNOSIS — Z85820 Personal history of malignant melanoma of skin: Secondary | ICD-10-CM

## 2023-12-06 DIAGNOSIS — D75839 Thrombocytosis, unspecified: Secondary | ICD-10-CM | POA: Diagnosis not present

## 2023-12-06 MED ORDER — AMLODIPINE BESYLATE 2.5 MG PO TABS: 2.5000 mg | ORAL_TABLET | Freq: Every day | ORAL | 1 refills | Status: DC

## 2023-12-06 NOTE — Assessment & Plan Note (Signed)
 The ASCVD Risk score (Arnett DK, et al., 2019) failed to calculate for the following reasons:   The valid HDL cholesterol range is 20 to 100 mg/dL

## 2023-12-06 NOTE — Assessment & Plan Note (Signed)
 Physical today 12/06/23.   PAP 01/2016 - negative with negative HPV. Repeat pap 06/23/21 - negative with negative HPV.  Colonoscopy 04/25/23 - non bleeding internal hemorrhoids.  Recommended f/u in 5 years. Mammogram 04/14/23 - Birads I.
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
## 2023-12-09 ENCOUNTER — Encounter: Payer: Self-pay | Admitting: Internal Medicine
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
## 2023-12-09 NOTE — Assessment & Plan Note (Signed)
 Followed by dermatology

## 2023-12-09 NOTE — Assessment & Plan Note (Signed)
 Continue amlodipine.   Follow pressures.  Follow metabolic panel. No changes today.

## 2023-12-09 NOTE — Assessment & Plan Note (Signed)
 Increased stress as outlined. Discussed today. Follow. Notify me if feels needs further intervention.

## 2024-01-19 ENCOUNTER — Encounter: Payer: Self-pay | Admitting: Pharmacist

## 2024-01-19 NOTE — Progress Notes (Signed)
 Pharmacy Quality Measure Review  This patient is appearing on a report for being at risk of failing the adherence measure for cholesterol (statin) medications this calendar year.   Medication: ROSUVASTATIN  5 MG Last fill date: 11/04/23 for 30 day supply  No longer taking. Patient declines cholesterol medication at this time.

## 2024-04-02 ENCOUNTER — Other Ambulatory Visit (INDEPENDENT_AMBULATORY_CARE_PROVIDER_SITE_OTHER)

## 2024-04-02 DIAGNOSIS — D75839 Thrombocytosis, unspecified: Secondary | ICD-10-CM | POA: Diagnosis not present

## 2024-04-02 DIAGNOSIS — E7800 Pure hypercholesterolemia, unspecified: Secondary | ICD-10-CM | POA: Diagnosis not present

## 2024-04-02 LAB — CBC WITH DIFFERENTIAL/PLATELET
Basophils Absolute: 0 K/uL (ref 0.0–0.1)
Basophils Relative: 0.7 % (ref 0.0–3.0)
Eosinophils Absolute: 0.1 K/uL (ref 0.0–0.7)
Eosinophils Relative: 1.4 % (ref 0.0–5.0)
HCT: 37.2 % (ref 36.0–46.0)
Hemoglobin: 12.5 g/dL (ref 12.0–15.0)
Lymphocytes Relative: 28.2 % (ref 12.0–46.0)
Lymphs Abs: 1.8 K/uL (ref 0.7–4.0)
MCHC: 33.5 g/dL (ref 30.0–36.0)
MCV: 94.9 fl (ref 78.0–100.0)
Monocytes Absolute: 0.9 K/uL (ref 0.1–1.0)
Monocytes Relative: 13.6 % — ABNORMAL HIGH (ref 3.0–12.0)
Neutro Abs: 3.5 K/uL (ref 1.4–7.7)
Neutrophils Relative %: 56.1 % (ref 43.0–77.0)
Platelets: 475 K/uL — ABNORMAL HIGH (ref 150.0–400.0)
RBC: 3.92 Mil/uL (ref 3.87–5.11)
RDW: 13 % (ref 11.5–15.5)
WBC: 6.3 K/uL (ref 4.0–10.5)

## 2024-04-02 LAB — HEPATIC FUNCTION PANEL
ALT: 15 U/L (ref 0–35)
AST: 29 U/L (ref 0–37)
Albumin: 4.3 g/dL (ref 3.5–5.2)
Alkaline Phosphatase: 36 U/L — ABNORMAL LOW (ref 39–117)
Bilirubin, Direct: 0.1 mg/dL (ref 0.0–0.3)
Total Bilirubin: 0.5 mg/dL (ref 0.2–1.2)
Total Protein: 7.3 g/dL (ref 6.0–8.3)

## 2024-04-02 LAB — LIPID PANEL
Cholesterol: 205 mg/dL — ABNORMAL HIGH (ref 0–200)
HDL: 102.5 mg/dL (ref >39.00)
LDL Cholesterol: 95 mg/dL (ref 0–99)
NonHDL: 102.75
Total CHOL/HDL Ratio: 2
Triglycerides: 38 mg/dL (ref 0.0–149.0)
VLDL: 7.6 mg/dL (ref 0.0–40.0)

## 2024-04-02 LAB — BASIC METABOLIC PANEL WITH GFR
BUN: 10 mg/dL (ref 6–23)
CO2: 28 meq/L (ref 19–32)
Calcium: 9.1 mg/dL (ref 8.4–10.5)
Chloride: 97 meq/L (ref 96–112)
Creatinine, Ser: 0.61 mg/dL (ref 0.40–1.20)
GFR: 89.78 mL/min (ref >60.00)
Glucose, Bld: 92 mg/dL (ref 70–99)
Potassium: 4.4 meq/L (ref 3.5–5.1)
Sodium: 134 meq/L — ABNORMAL LOW (ref 135–145)

## 2024-04-03 ENCOUNTER — Ambulatory Visit: Payer: Self-pay | Admitting: Internal Medicine

## 2024-04-06 ENCOUNTER — Encounter: Payer: Self-pay | Admitting: Internal Medicine

## 2024-04-06 ENCOUNTER — Ambulatory Visit (INDEPENDENT_AMBULATORY_CARE_PROVIDER_SITE_OTHER): Admitting: Internal Medicine

## 2024-04-06 VITALS — BP 120/74 | HR 71 | Temp 97.9°F | Ht 65.0 in | Wt 130.8 lb

## 2024-04-06 DIAGNOSIS — E7800 Pure hypercholesterolemia, unspecified: Secondary | ICD-10-CM

## 2024-04-06 DIAGNOSIS — I10 Essential (primary) hypertension: Secondary | ICD-10-CM

## 2024-04-06 DIAGNOSIS — E871 Hypo-osmolality and hyponatremia: Secondary | ICD-10-CM

## 2024-04-06 DIAGNOSIS — D649 Anemia, unspecified: Secondary | ICD-10-CM | POA: Diagnosis not present

## 2024-04-06 DIAGNOSIS — F439 Reaction to severe stress, unspecified: Secondary | ICD-10-CM

## 2024-04-06 DIAGNOSIS — D75839 Thrombocytosis, unspecified: Secondary | ICD-10-CM

## 2024-04-06 MED ORDER — AMLODIPINE BESYLATE 2.5 MG PO TABS: 2.5000 mg | ORAL_TABLET | Freq: Every day | ORAL | 1 refills | Status: DC

## 2024-04-06 NOTE — Assessment & Plan Note (Addendum)
 Off crestor . Desires not to take.  Follow lipid panel.
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
## 2024-04-08 ENCOUNTER — Encounter: Payer: Self-pay | Admitting: Internal Medicine

## 2024-04-08 NOTE — Assessment & Plan Note (Signed)
Increased stress as outlined.  Discussed.  Will notify me if she feels she needs any further intervention.  Follow.

## 2024-04-08 NOTE — Assessment & Plan Note (Signed)
 Continue amlodipine.   Follow pressures.  Follow metabolic panel. No changes today.

## 2024-04-08 NOTE — Assessment & Plan Note (Signed)
 Check cbc

## 2024-04-08 NOTE — Assessment & Plan Note (Signed)
 Colonoscopy 2019.  Follow cbc. F/u colonoscopy 04/2023 - internal hemorrhoids. Recommended f/u in 5 years.

## 2024-05-04 ENCOUNTER — Other Ambulatory Visit (INDEPENDENT_AMBULATORY_CARE_PROVIDER_SITE_OTHER)

## 2024-05-04 DIAGNOSIS — E871 Hypo-osmolality and hyponatremia: Secondary | ICD-10-CM | POA: Diagnosis not present

## 2024-05-04 DIAGNOSIS — E7800 Pure hypercholesterolemia, unspecified: Secondary | ICD-10-CM

## 2024-05-04 DIAGNOSIS — D649 Anemia, unspecified: Secondary | ICD-10-CM | POA: Diagnosis not present

## 2024-05-04 DIAGNOSIS — D75839 Thrombocytosis, unspecified: Secondary | ICD-10-CM

## 2024-05-04 NOTE — Addendum Note (Signed)
 Addended by: MARYLEN PRO A on: 05/04/2024 01:52 PM   Modules accepted: Orders

## 2024-05-04 NOTE — Addendum Note (Signed)
 Addended by: MARYLEN PRO A on: 05/04/2024 01:53 PM   Modules accepted: Orders

## 2024-05-04 NOTE — Addendum Note (Signed)
 Addended by: BRIEN SHARENE RAMAN on: 05/04/2024 03:10 PM   Modules accepted: Orders

## 2024-05-05 ENCOUNTER — Ambulatory Visit: Payer: Self-pay | Admitting: Internal Medicine

## 2024-05-05 LAB — LIPID PANEL
Chol/HDL Ratio: 1.9 ratio (ref 0.0–4.4)
Cholesterol, Total: 210 mg/dL — ABNORMAL HIGH (ref 100–199)
HDL: 111 mg/dL (ref >39)
LDL Chol Calc (NIH): 89 mg/dL (ref 0–99)
Triglycerides: 54 mg/dL (ref 0–149)
VLDL Cholesterol Cal: 10 mg/dL (ref 5–40)

## 2024-05-05 LAB — CBC WITH DIFFERENTIAL/PLATELET
Absolute Lymphocytes: 2329 {cells}/uL (ref 850–3900)
Absolute Monocytes: 611 {cells}/uL (ref 200–950)
Basophils Absolute: 28 {cells}/uL (ref 0–200)
Basophils Relative: 0.4 %
Eosinophils Absolute: 43 {cells}/uL (ref 15–500)
Eosinophils Relative: 0.6 %
HCT: 40.3 % (ref 35.0–45.0)
Hemoglobin: 13.1 g/dL (ref 11.7–15.5)
MCH: 32.1 pg (ref 27.0–33.0)
MCHC: 32.5 g/dL (ref 32.0–36.0)
MCV: 98.8 fL (ref 80.0–100.0)
MPV: 9.2 fL (ref 7.5–12.5)
Monocytes Relative: 8.6 %
Neutro Abs: 4090 {cells}/uL (ref 1500–7800)
Neutrophils Relative %: 57.6 %
Platelets: 464 Thousand/uL — ABNORMAL HIGH (ref 140–400)
RBC: 4.08 Million/uL (ref 3.80–5.10)
RDW: 11.8 % (ref 11.0–15.0)
Total Lymphocyte: 32.8 %
WBC: 7.1 Thousand/uL (ref 3.8–10.8)

## 2024-05-05 LAB — BASIC METABOLIC PANEL WITH GFR
BUN: 11 mg/dL (ref 7–25)
CO2: 27 mmol/L (ref 20–32)
Calcium: 9.8 mg/dL (ref 8.6–10.4)
Chloride: 102 mmol/L (ref 98–110)
Creat: 0.65 mg/dL (ref 0.60–1.00)
Glucose, Bld: 112 mg/dL — ABNORMAL HIGH (ref 65–99)
Potassium: 4.3 mmol/L (ref 3.5–5.3)
Sodium: 140 mmol/L (ref 135–146)
eGFR: 94 mL/min/1.73m2 (ref >=60)

## 2024-05-05 LAB — HEPATIC FUNCTION PANEL
ALT: 17 IU/L (ref 0–32)
AST: 24 IU/L (ref 0–40)
Albumin: 4.6 g/dL (ref 3.8–4.8)
Alkaline Phosphatase: 45 IU/L (ref 44–121)
Bilirubin Total: 0.4 mg/dL (ref 0.0–1.2)
Bilirubin, Direct: 0.16 mg/dL (ref 0.00–0.40)
Total Protein: 7.4 g/dL (ref 6.0–8.5)

## 2024-05-05 LAB — TSH: TSH: 1.54 m[IU]/L (ref 0.40–4.50)

## 2024-05-11 ENCOUNTER — Ambulatory Visit (INDEPENDENT_AMBULATORY_CARE_PROVIDER_SITE_OTHER): Admitting: *Deleted

## 2024-05-11 VITALS — BP 127/70 | Ht 65.0 in | Wt 124.0 lb

## 2024-05-11 DIAGNOSIS — Z0000 Encounter for general adult medical examination without abnormal findings: Secondary | ICD-10-CM

## 2024-05-11 DIAGNOSIS — Z780 Asymptomatic menopausal state: Secondary | ICD-10-CM | POA: Diagnosis not present
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
## 2024-05-11 NOTE — Patient Instructions (Signed)
 Ms. Needles , Thank you for taking time out of your busy schedule to complete your Annual Wellness Visit with me. I enjoyed our conversation and look forward to speaking with you again next year. I, as well as your care team,  appreciate your ongoing commitment to your health goals. Please review the following plan we discussed and let me know if I can assist you in the future. Your Game plan/ To Do List    Referrals: If you haven't heard from the office you've been referred to, please reach out to them at the phone provided.   An order has been placed for your Dexa/Done Density test. Remember to call and schedule an eye exam and get your annual flu vaccine. You have an order for:  []   2D Mammogram  []   3D Mammogram  [x]   Bone Density     Please call for appointment:  [facility]  [address] [place] [address] [phone]     Make sure to wear two-piece clothing.  No lotions, powders, or deodorants the day of the appointment. Make sure to bring picture ID and insurance card.  Bring list of medications you are currently taking including any supplements.    Follow up Visits: We will see or speak with you next year for your Next Medicare AWV with our clinical staff 05/14/25 @ 3:40 Have you seen your provider in the last 6 months (3 months if uncontrolled diabetes)? Yes  Clinician Recommendations:  Aim for 30 minutes of exercise or brisk walking, 6-8 glasses of water , and 5 servings of fruits and vegetables each day.       This is a list of the screenings recommended for you:  Health Maintenance  Topic Date Due   DEXA scan (bone density measurement)  02/12/2021   Mammogram  04/12/2024   Flu Shot  04/13/2024   DTaP/Tdap/Td vaccine (2 - Tdap) 04/06/2025*   Medicare Annual Wellness Visit  05/11/2025   Colon Cancer Screening  04/24/2028   Pneumococcal Vaccine for age over 56  Completed   Hepatitis C Screening  Completed   HPV  Vaccine  Aged Out   Meningitis B Vaccine  Aged Out   COVID-19 Vaccine  Discontinued   Zoster (Shingles) Vaccine  Discontinued  *Topic was postponed. The date shown is not the original due date.    Advanced directives: (Declined) Advance directive discussed with you today. Even though you declined this today, please call our office should you change your mind, and we can give you the proper paperwork for you to fill out. Advance Care Planning is important because it:  [x]  Makes sure you receive the medical care that is consistent with your values, goals, and preferences  [x]  It provides guidance to your family and loved ones and reduces their decisional burden about whether or not they are making the right decisions based on your wishes.

## 2024-08-07 ENCOUNTER — Ambulatory Visit: Admitting: Internal Medicine

## 2024-08-07 VITALS — BP 130/62 | HR 72 | Temp 97.9°F | Ht 65.0 in | Wt 132.1 lb

## 2024-08-07 DIAGNOSIS — D649 Anemia, unspecified: Secondary | ICD-10-CM

## 2024-08-07 DIAGNOSIS — I10 Essential (primary) hypertension: Secondary | ICD-10-CM

## 2024-08-07 DIAGNOSIS — Z800 Family history of malignant neoplasm of digestive organs: Secondary | ICD-10-CM | POA: Diagnosis not present

## 2024-08-07 DIAGNOSIS — Z85820 Personal history of malignant melanoma of skin: Secondary | ICD-10-CM | POA: Diagnosis not present

## 2024-08-07 DIAGNOSIS — E7800 Pure hypercholesterolemia, unspecified: Secondary | ICD-10-CM

## 2024-08-07 DIAGNOSIS — F439 Reaction to severe stress, unspecified: Secondary | ICD-10-CM | POA: Diagnosis not present

## 2024-08-07 DIAGNOSIS — D75839 Thrombocytosis, unspecified: Secondary | ICD-10-CM | POA: Diagnosis not present

## 2024-08-07 DIAGNOSIS — Z1231 Encounter for screening mammogram for malignant neoplasm of breast: Secondary | ICD-10-CM

## 2024-08-07 MED ORDER — AMLODIPINE BESYLATE 2.5 MG PO TABS: 2.5000 mg | ORAL_TABLET | Freq: Every day | ORAL | 1 refills | Status: AC
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
## 2024-08-12 ENCOUNTER — Encounter: Payer: Self-pay | Admitting: Internal Medicine

## 2024-08-12 NOTE — Assessment & Plan Note (Signed)
 Check cbc

## 2024-08-12 NOTE — Assessment & Plan Note (Signed)
 Continue amlodipine . Follow pressures. Follow metabolic panel. No change in medication today.

## 2024-08-12 NOTE — Assessment & Plan Note (Signed)
Off crestor.  Desires not to restart.  Follow lipid panel.   

## 2024-08-12 NOTE — Assessment & Plan Note (Signed)
 Increased stress as outlined. Discussed. She does not feel needs any further intervention at this time. Discussed counseling. Notify me if feels needs further intervention. Follow.

## 2024-08-12 NOTE — Assessment & Plan Note (Signed)
 Followed by dermatology

## 2024-08-12 NOTE — Assessment & Plan Note (Signed)
 Colonoscopy 04/25/23 - non bleeding internal hemorrhoids.  Recommended f/u in 5 years.

## 2024-08-12 NOTE — Assessment & Plan Note (Signed)
 Colonoscopy 2019.  F/u colonoscopy 04/2023 - internal hemorrhoids. Recommended f/u in 5 years. Follow cbc.

## 2024-09-04 ENCOUNTER — Other Ambulatory Visit

## 2024-09-04 DIAGNOSIS — D75839 Thrombocytosis, unspecified: Secondary | ICD-10-CM | POA: Diagnosis not present

## 2024-09-04 DIAGNOSIS — E7800 Pure hypercholesterolemia, unspecified: Secondary | ICD-10-CM

## 2024-09-04 LAB — HEPATIC FUNCTION PANEL
ALT: 16 U/L (ref 3–35)
AST: 21 U/L (ref 5–37)
Albumin: 4.4 g/dL (ref 3.5–5.2)
Alkaline Phosphatase: 32 U/L — ABNORMAL LOW (ref 39–117)
Bilirubin, Direct: 0.1 mg/dL (ref 0.1–0.3)
Total Bilirubin: 0.5 mg/dL (ref 0.2–1.2)
Total Protein: 7.1 g/dL (ref 6.0–8.3)

## 2024-09-04 LAB — CBC WITH DIFFERENTIAL/PLATELET
Basophils Absolute: 0 K/uL (ref 0.0–0.1)
Basophils Relative: 0.8 % (ref 0.0–3.0)
Eosinophils Absolute: 0.1 K/uL (ref 0.0–0.7)
Eosinophils Relative: 1 % (ref 0.0–5.0)
HCT: 34.8 % — ABNORMAL LOW (ref 36.0–46.0)
Hemoglobin: 12 g/dL (ref 12.0–15.0)
Lymphocytes Relative: 33.6 % (ref 12.0–46.0)
Lymphs Abs: 1.8 K/uL (ref 0.7–4.0)
MCHC: 34.5 g/dL (ref 30.0–36.0)
MCV: 96.8 fl (ref 78.0–100.0)
Monocytes Absolute: 0.6 K/uL (ref 0.1–1.0)
Monocytes Relative: 10.6 % (ref 3.0–12.0)
Neutro Abs: 2.9 K/uL (ref 1.4–7.7)
Neutrophils Relative %: 54 % (ref 43.0–77.0)
Platelets: 389 K/uL (ref 150.0–400.0)
RBC: 3.6 Mil/uL — ABNORMAL LOW (ref 3.87–5.11)
RDW: 12.4 % (ref 11.5–15.5)
WBC: 5.5 K/uL (ref 4.0–10.5)

## 2024-09-04 LAB — BASIC METABOLIC PANEL WITH GFR
BUN: 14 mg/dL (ref 6–23)
CO2: 30 meq/L (ref 19–32)
Calcium: 9.2 mg/dL (ref 8.4–10.5)
Chloride: 103 meq/L (ref 96–112)
Creatinine, Ser: 0.51 mg/dL (ref 0.40–1.20)
GFR: 93.46 mL/min
Glucose, Bld: 90 mg/dL (ref 70–99)
Potassium: 4.6 meq/L (ref 3.5–5.1)
Sodium: 141 meq/L (ref 135–145)

## 2024-09-04 LAB — LIPID PANEL
Cholesterol: 209 mg/dL — ABNORMAL HIGH (ref 28–200)
HDL: 100.6 mg/dL
LDL Cholesterol: 97 mg/dL (ref 10–99)
NonHDL: 108.5
Total CHOL/HDL Ratio: 2
Triglycerides: 57 mg/dL (ref 10.0–149.0)
VLDL: 11.4 mg/dL (ref 0.0–40.0)

## 2024-09-05 ENCOUNTER — Ambulatory Visit: Payer: Self-pay | Admitting: Internal Medicine

## 2024-10-17 ENCOUNTER — Ambulatory Visit
Admission: RE | Admit: 2024-10-17 | Discharge: 2024-10-17 | Disposition: A | Source: Ambulatory Visit | Attending: Nurse Practitioner

## 2024-10-17 ENCOUNTER — Ambulatory Visit: Admitting: Nurse Practitioner

## 2024-10-17 ENCOUNTER — Encounter: Payer: Self-pay | Admitting: Internal Medicine

## 2024-10-17 ENCOUNTER — Encounter: Payer: Self-pay | Admitting: Nurse Practitioner

## 2024-10-17 ENCOUNTER — Ambulatory Visit: Payer: Self-pay

## 2024-10-17 ENCOUNTER — Ambulatory Visit: Payer: Self-pay | Admitting: Nurse Practitioner

## 2024-10-17 VITALS — BP 177/82 | HR 75 | Temp 98.0°F | Ht 65.0 in | Wt 137.4 lb

## 2024-10-17 DIAGNOSIS — R10A0 Flank pain, unspecified side: Secondary | ICD-10-CM | POA: Insufficient documentation

## 2024-10-17 DIAGNOSIS — N3001 Acute cystitis with hematuria: Secondary | ICD-10-CM

## 2024-10-17 DIAGNOSIS — R10A2 Flank pain, left side: Secondary | ICD-10-CM

## 2024-10-17 DIAGNOSIS — R319 Hematuria, unspecified: Secondary | ICD-10-CM

## 2024-10-17 LAB — URINALYSIS, ROUTINE W REFLEX MICROSCOPIC
Bilirubin Urine: NEGATIVE
Ketones, ur: NEGATIVE
Nitrite: NEGATIVE
Specific Gravity, Urine: <=1.005 — AB (ref 1.000–1.030)
Total Protein, Urine: NEGATIVE
Urine Glucose: NEGATIVE
Urobilinogen, UA: 0.2 (ref 0.0–1.0)
pH: 6.5 (ref 5.0–8.0)

## 2024-10-17 LAB — POCT URINE DIPSTICK
Bilirubin, UA: NEGATIVE
Glucose, UA: NEGATIVE mg/dL
Ketones, POC UA: NEGATIVE mg/dL
Nitrite, UA: NEGATIVE
POC PROTEIN,UA: 30 — AB
Spec Grav, UA: 1.01
Urobilinogen, UA: 0.2 U/dL
pH, UA: 7

## 2024-10-17 MED ORDER — CIPROFLOXACIN HCL 500 MG PO TABS: 500.0000 mg | ORAL_TABLET | Freq: Two times a day (BID) | ORAL | 0 refills | Status: AC

## 2024-10-17 NOTE — Telephone Encounter (Signed)
 Evaluated this am. Renal CT stone study

## 2024-10-17 NOTE — Telephone Encounter (Signed)
 Evaluated. Planning CT renal stone study.

## 2024-10-17 NOTE — Telephone Encounter (Signed)
 FYI Only or Action Required?: FYI only for provider: appointment scheduled on 2/4.  Patient was last seen in primary care on 08/07/2024 by Glendia Shad, MD.  Called Nurse Triage reporting Back Pain.  Symptoms began 2 days ago.  Interventions attempted: OTC medications: pain meds.  Symptoms are: gradually worsening.  Triage Disposition: See Physician Within 24 Hours  Patient/caregiver understands and will follow disposition?:   Reason for Triage: patient is stating she is having severe pain in her middle/lower back for a few days. She says that the pain feels like a kidney infection.   Reason for Disposition  MODERATE pain (e.g., interferes with normal activities or awakens from sleep)  Answer Assessment - Initial Assessment Questions 1. LOCATION: Where does it hurt? (e.g., left, right)     Flank area both sides 2. ONSET: When did the pain start?     Monday 3. SEVERITY: How bad is the pain? (e.g., Scale 1-10; mild, moderate, or severe)     Mild to moderate 4. PATTERN: Does the pain come and go, or is it constant?      intermittent 5. CAUSE: What do you think is causing the pain?     unknown 6. OTHER SYMPTOMS:  Do you have any other symptoms? (e.g., fever, abdomen pain, vomiting, leg weakness, burning with urination, blood in urine)     denies  Protocols used: Flank Pain-A-AH

## 2024-10-17 NOTE — Assessment & Plan Note (Addendum)
 Suspected nephrolithiasis due to unilateral flank pain and hematuria. Denies other urinary symptoms. STAT CT Renal Stone Study ordered for further evaluation. UA in office with moderate blood, protein and small leukocytes. Microscopic and culture pending. Further work up pending results.

## 2024-10-17 NOTE — Progress Notes (Signed)
 " Leron Glance, NP-C Phone: [phone]  Discussed the use of AI scribe software for clinical note transcription with the patient, who gave verbal consent to proceed.  History of Present Illness   Julie Porter is a 74 year old female who presents with left-sided flank pain.  The left-sided flank pain began on Monday as mild discomfort in the back and progressively worsened, becoming severe by last night. The pain is located on the left side and radiates from the back to the front. It is described as 'weird', persistent, and not improving.  She has no history of kidney stones but has a history of kidney infections. There is no burning during urination, increased frequency, urgency, or noticeable blood in the urine. However, she mentions a longstanding presence of blood in her urine, which was previously investigated but not recently monitored.  No nausea, vomiting, diarrhea, or abdominal pain is reported. She notes not having a bowel movement in 2 days. There is no fever, headaches, vision changes, or chest pain.  Her blood pressure was noted to be high at home, with readings in the 150s, and was recorded as 180 during the visit.      Tobacco Use History[1]  Medications Ordered Prior to Encounter[2]   ROS see history of present illness  Objective  Physical Exam Vitals:   10/17/24 1000 10/17/24 1025  BP: (!) 186/76 (!) 177/82  Pulse: 75   Temp: 98 F (36.7 C)   SpO2: 99%     BP Readings from Last 3 Encounters:  10/17/24 (!) 177/82  08/07/24 130/62  05/11/24 127/70   Wt Readings from Last 3 Encounters:  10/17/24 137 lb 6.4 oz (62.3 kg)  08/07/24 132 lb 2 oz (59.9 kg)  05/11/24 124 lb (56.2 kg)    Physical Exam Constitutional:      General: She is not in acute distress.    Appearance: Normal appearance.  HENT:     Head: Normocephalic.  Cardiovascular:     Rate and Rhythm: Normal rate and regular rhythm.     Heart sounds: Normal heart sounds.  Pulmonary:      Effort: Pulmonary effort is normal.     Breath sounds: Normal breath sounds.  Abdominal:     General: Abdomen is flat. Bowel sounds are normal.     Palpations: Abdomen is soft.     Tenderness: There is left CVA tenderness.  Skin:    General: Skin is warm and dry.  Neurological:     General: No focal deficit present.     Mental Status: She is alert.  Psychiatric:        Mood and Affect: Mood normal.        Behavior: Behavior normal.      Assessment/Plan: Please see individual problem list.  Left flank pain Assessment & Plan: Suspected nephrolithiasis due to unilateral flank pain and hematuria. Denies other urinary symptoms. STAT CT Renal Stone Study ordered for further evaluation. UA in office with moderate blood, protein and small leukocytes. Microscopic and culture pending. Further work up pending results.   Orders: -     POCT URINE DIPSTICK -     Urinalysis, Routine w reflex microscopic -     Urine Culture -     CT RENAL STONE STUDY; Future  Hematuria, unspecified type -     CT RENAL STONE STUDY; Future     Return if symptoms worsen or fail to improve.   Leron Glance, NP-C  Primary Care - [facility]    [  1]  Social History Tobacco Use  Smoking Status Never  Smokeless Tobacco Never  [2]  Current Outpatient Medications on File Prior to Visit  Medication Sig Dispense Refill   amLODipine  (NORVASC ) 2.5 MG tablet Take 1 tablet (2.5 mg total) by mouth daily. 90 tablet 1   aspirin EC 81 MG tablet Take 81 mg by mouth daily. Swallow whole.     b complex vitamins capsule Take 1 capsule by mouth daily. (Patient taking differently: Take by mouth daily. Patient takes a gummy)     cyanocobalamin  (VITAMIN B12) 1000 MCG tablet Take 1,000 mcg by mouth daily.     ferrous sulfate 325 (65 FE) MG EC tablet Take 325 mg by mouth daily.     Multiple Vitamins-Minerals (MULTIVITAMIN PO) Take 1 capsule by mouth daily.      Omega-3 1000 MG CAPS Take by mouth 2 (two) times  daily.     Turmeric 500 MG TABS Take by mouth.     No current facility-administered medications on file prior to visit.   "

## 2024-10-18 ENCOUNTER — Ambulatory Visit

## 2024-10-18 ENCOUNTER — Ambulatory Visit: Payer: Self-pay

## 2024-10-18 VITALS — BP 130/70 | HR 74 | Temp 98.7°F | Ht 65.0 in | Wt 136.2 lb

## 2024-10-18 DIAGNOSIS — B029 Zoster without complications: Secondary | ICD-10-CM | POA: Insufficient documentation

## 2024-10-18 LAB — URINE CULTURE
MICRO NUMBER:: 17548159
Result:: NO GROWTH
SPECIMEN QUALITY:: ADEQUATE

## 2024-10-18 MED ORDER — VALACYCLOVIR HCL 1 G PO TABS: 1000.0000 mg | ORAL_TABLET | Freq: Three times a day (TID) | ORAL | 0 refills | Status: AC

## 2024-10-18 NOTE — Assessment & Plan Note (Addendum)
 Herpes zoster with left-sided back pain, rash consistent with shingles. Advised on transmission risk to immunocompromised individuals. - Prescribed valacyclovir  1000 mg TID for 7-10 days. Discontinue if rash resolves in 7 days. - Recommended Tylenol 500 mg TID PRN for pain. - Suggested lidocaine  cream or spray for topical pain relief. - Advised against contact with immunocompromised individuals until rash is fully crusted. - Instructed to seek immediate care if symptoms worsen or severe symptoms develop. - Discussed potential use of capsaicin if lidocaine  is ineffective.  Also discussed increased risk of post herpetic neuralgia, need for medication if pain persist.   - Recommend follow-up with PCP for chronic medication management.  Also encouraged patient to update second dose of shingles vaccine once her symptoms fully resolved.

## 2024-10-18 NOTE — Telephone Encounter (Signed)
 FYI Only or Action Required?: FYI only for provider: appointment scheduled on 10/18/24.  Patient was last seen in primary care on 10/17/2024 by Gretel App, NP.  Called Nurse Triage reporting Back Pain and Rash.  Symptoms began last night.  Interventions attempted: OTC medications: Tylenol.  Symptoms are: gradually worsening.  Triage Disposition: See Physician Within 24 Hours  Patient/caregiver understands and will follow disposition?: Yes  Message from [facility] S sent at 10/18/2024  8:46 AM EST  Reason for Triage: pain lower left back level 7/8. Patient also believes she has shingles   Reason for Disposition  [1] Shingles rash (matches SYMPTOMS) AND [2] onset < 72 hours ago (3 days)  Answer Assessment - Initial Assessment Questions Patient called in with continuous lower back pain and rash.   1. APPEARANCE of RASH: What does the rash look like?      2 red rash areas with small bumps, one area the size of a dime  2. LOCATION: Where is the rash located?      Lower left side of back  3. ONSET: When did the rash start?      Noticed last night  4. ITCHING: Does the rash itch? If Yes, ask: How bad is the itch?  (Scale 1-10; or mild, moderate, severe)     Some of the rash itches but not constant  5. PAIN: Does the rash hurt? If Yes, ask: How bad is the pain?  (Scale 0-10; or none, mild, moderate, severe)     Has lower left side back pain - 7/10  6. OTHER SYMPTOMS: Do you have any other symptoms? (e.g., fever)     Denies fever  Protocols used: Shingles (Zoster)-A-AH

## 2024-10-18 NOTE — Patient Instructions (Signed)
 Keep the rash clean and dry. Okay to use lidocaine  for pain.  Avoid contact with immunocompromised individuals, pregnant women, and infants until lesions have crusted over. Seek immediate medical attention if symptoms worsen or new symptoms develop.  Take Valtrex  1000 mg three times a day for 7-10 days. If rash resolves in 7 days you can hold off but if it does not then take it for 10 days. Take Tylenol 500 mg three times a day as needed for pain as well.

## 2024-10-18 NOTE — Telephone Encounter (Signed)
 Noted.

## 2024-10-18 NOTE — Progress Notes (Signed)
 "  Acute Office Visit  Subjective:    Patient ID: Julie Porter, female    DOB: 12/03/1950, 74 y.o.   MRN: [id number]  Chief Complaint  Patient presents with   Herpes Zoster   Rash   Back Pain    Discussed the use of AI scribe software for clinical note transcription with the patient, who gave verbal consent to proceed.  History of Present Illness Julie Porter is a 74 year old female who presents with left-sided back pain and skin lesions.   She experiences  left-sided back pain that radiates to the front, rated at seven to eight out of ten in intensity. The pain worsens when lying down and improves when standing, making sleep difficult. She noticed a rough spot on her back and side but has not observed any rash or bleeding.  She does not recall exact onset of these lesions.  Received single dose of Shingrix in 05/07/20, did not get second dose due to  not feeling too well after receiving first dose of Shingrix.  She experienced a mild sweat in the morning. No fever, chills, numbness, or tingling in her legs.  Patient initially thought she had nephrolithiasis.  She was seen in the clinic on 10/17/2024 where UA was positive for leukocytes, red blood cells.  CT done to rule out nephrolithiasis was reassuring.  She was prescribed ciprofloxacin  but has not started yet.  Urine culture is pending.  She has a history of hypertension on amlodipine  2.5 mg.  She was noted to have elevated blood pressure so she took amlodipine  5 mg to help with BP.   She reports increased water  intake and consumes one to two glasses of alcohol at night.      ROS As per HPI    Objective:    BP 130/70 (BP Location: Right Arm, Patient Position: Sitting)   Pulse 74   Temp 98.7 F (37.1 C) (Oral)   Ht 5' 5 (1.651 m)   Wt 136 lb 3.2 oz (61.8 kg)   LMP 10/11/2007   SpO2 98%   BMI 22.66 kg/m    Physical Exam HENT:     Head: Normocephalic and atraumatic.     Mouth/Throat:     Mouth: Mucous membranes are  moist.  Cardiovascular:     Rate and Rhythm: Normal rate.  Abdominal:     Tenderness: There is no abdominal tenderness. There is no guarding.  Musculoskeletal:     Cervical back: Neck supple.     Right lower leg: No edema.     Left lower leg: No edema.  Skin:    Findings: Lesion (left lower back, abdomen: Localized unilateral, clustered erythematous papule without obvious purulent.) present.  Neurological:     Mental Status: She is alert and oriented to person, place, and time.  Psychiatric:        Mood and Affect: Mood is anxious.        Behavior: Behavior is cooperative.      No results found for any visits on 10/18/24.     Assessment & Plan:  Pleasant 74 year old female presenting for evaluation of rash.  UA from 10/17/2024 suspicious for urinary tract infection.  Urine culture is pending. Start ciprofloxacin  if urine culture confirms infection.  Assessment & Plan Herpes zoster without complication Herpes zoster with left-sided back pain, rash consistent with shingles. Advised on transmission risk to immunocompromised individuals. - Prescribed valacyclovir  1000 mg TID for 7-10 days. Discontinue if rash resolves in 7 days. -  Recommended Tylenol 500 mg TID PRN for pain. - Suggested lidocaine  cream or spray for topical pain relief. - Advised against contact with immunocompromised individuals until rash is fully crusted. - Instructed to seek immediate care if symptoms worsen or severe symptoms develop. - Discussed potential use of capsaicin if lidocaine  is ineffective.  Also discussed increased risk of post herpetic neuralgia, need for medication if pain persist.   - Recommend follow-up with PCP for chronic medication management.  Also encouraged patient to update second dose of shingles vaccine once her symptoms fully resolved.        Return if symptoms worsen or fail to improve/F/U with PCP for chronic concerns.  Luke Shade, MD  "

## 2024-12-11 ENCOUNTER — Encounter: Admitting: Internal Medicine

## 2025-05-14 ENCOUNTER — Ambulatory Visit
